# Patient Record
Sex: Male | Born: 1950
Health system: Southern US, Community
[De-identification: ages and names within clinical notes are randomized; demographics above are authoritative.]

## PROBLEM LIST (undated history)

## (undated) DIAGNOSIS — E785 Hyperlipidemia, unspecified: Secondary | ICD-10-CM

## (undated) DIAGNOSIS — E119 Type 2 diabetes mellitus without complications: Secondary | ICD-10-CM

## (undated) DIAGNOSIS — C61 Malignant neoplasm of prostate: Secondary | ICD-10-CM

## (undated) DIAGNOSIS — I10 Essential (primary) hypertension: Secondary | ICD-10-CM

## (undated) HISTORY — DX: Type 2 diabetes mellitus without complications: E11.9

## (undated) HISTORY — DX: Malignant neoplasm of prostate: C61

## (undated) HISTORY — PX: OTHER SURGICAL HISTORY: SHX169

## (undated) HISTORY — DX: Hyperlipidemia, unspecified: E78.5

## (undated) HISTORY — DX: Essential (primary) hypertension: I10

## (undated) HISTORY — PX: COLONOSCOPY: SHX174

---
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
## 1997-06-30 ENCOUNTER — Other Ambulatory Visit: Admission: RE | Admit: 1997-06-30 | Discharge: 1997-06-30 | Payer: Self-pay | Admitting: Family Medicine

## 2011-01-07 ENCOUNTER — Telehealth: Payer: Self-pay | Admitting: Internal Medicine

## 2011-01-07 NOTE — Telephone Encounter (Signed)
Received 12 pages from [facility]; forwarded to Dr. Leone Payor for review. 01/07/11-ar

## 2011-01-24 ENCOUNTER — Ambulatory Visit (AMBULATORY_SURGERY_CENTER): Payer: BC Managed Care – PPO

## 2011-01-24 ENCOUNTER — Encounter: Payer: Self-pay | Admitting: Internal Medicine

## 2011-01-24 VITALS — Ht 68.0 in | Wt 216.0 lb

## 2011-01-24 DIAGNOSIS — Z860100 Personal history of colon polyps, unspecified: Secondary | ICD-10-CM

## 2011-01-24 DIAGNOSIS — Z1211 Encounter for screening for malignant neoplasm of colon: Secondary | ICD-10-CM

## 2011-01-24 MED ORDER — PEG-KCL-NACL-NASULF-NA ASC-C 100 G PO SOLR: 1.0000 | Freq: Once | ORAL | Status: DC

## 2011-02-07 ENCOUNTER — Ambulatory Visit (AMBULATORY_SURGERY_CENTER): Payer: BC Managed Care – PPO | Admitting: Internal Medicine

## 2011-02-07 ENCOUNTER — Encounter: Payer: Self-pay | Admitting: Internal Medicine

## 2011-02-07 VITALS — BP 146/83 | HR 73 | Temp 97.8°F | Resp 17 | Ht 68.0 in | Wt 216.0 lb

## 2011-02-07 DIAGNOSIS — D126 Benign neoplasm of colon, unspecified: Secondary | ICD-10-CM

## 2011-02-07 DIAGNOSIS — Z860100 Personal history of colon polyps, unspecified: Secondary | ICD-10-CM | POA: Insufficient documentation

## 2011-02-07 DIAGNOSIS — D1330 Benign neoplasm of unspecified part of small intestine: Secondary | ICD-10-CM

## 2011-02-07 DIAGNOSIS — Z1211 Encounter for screening for malignant neoplasm of colon: Secondary | ICD-10-CM

## 2011-02-07 MED ORDER — SODIUM CHLORIDE 0.9 % IV SOLN: 500.0000 mL | INTRAVENOUS | Status: DC

## 2011-02-07 NOTE — Patient Instructions (Addendum)
Multiple (about 10) tiny polyps were removed today. I will send you a letter about the biopsy results and timing of next colonoscopy. I am going to ask for your prior colonoscopy records also, as that may help to determine when your next colonoscopy should be. Iva Boop, MD, [facility]   Discharge instructions per blue and green sheets  Handout on polyps  We will mail you a letter in 1-2 weeks with the  Pathology results and Dr Marvell Fuller recommendations

## 2011-02-07 NOTE — Progress Notes (Signed)
Patient did not experience any of the following events: a burn prior to discharge; a fall within the facility; wrong site/side/patient/procedure/implant event; or a hospital transfer or hospital admission upon discharge from the facility. (G8907) Patient did not have preoperative order for IV antibiotic SSI prophylaxis. (G8918)  

## 2011-02-07 NOTE — Op Note (Signed)
[facility] [address] [place], [place]  [address]  COLONOSCOPY PROCEDURE REPORT  PATIENT:  Bobby Boyle, Bobby Boyle  MR#:  [id number] BIRTHDATE:  1950-09-22, 60 yrs. old  GENDER:  male ENDOSCOPIST:  Iva Boop, MD, [facility] REF. BY:  Kellie Shropshire, M.D. PROCEDURE DATE:  02/07/2011 PROCEDURE:  Colonoscopy with biopsy ASA CLASS:  Class II INDICATIONS:  surveillance and high-risk screening, history of polyps prior colonoscopy with polyps - do not have records MEDICATIONS:   These medications were titrated to patient response per physician's verbal order, Fentanyl 100 mcg IV, Versed 10 mg IV  DESCRIPTION OF PROCEDURE:   After the risks benefits and alternatives of the procedure were thoroughly explained, informed consent was obtained.  Digital rectal exam was performed and revealed no rectal masses.   The LB CF-H180AL [id number] endoscope was introduced through the anus and advanced to the cecum, which was identified by both the appendix and ileocecal valve, without limitations.  The quality of the prep was adequate, using MoviPrep.  The instrument was then slowly withdrawn as the colon was fully examined. <<PROCEDUREIMAGES>>  FINDINGS:  There were multiple polyps identified and removed. Approximately 10 polyps removed. All diminutive polyps. 1 transverse polyp about 4 mm removed with cold snare. The remainder (1 hepatic flexure and the rest in cecum were 1-3 mm and removed with forceps).  This was otherwise a normal examination of the colon. Prep adequate after extensive irrigation only. Retroflexed views in the rectum revealed no abnormalities.    The time to cecum = 4:49 minutes. The scope was then withdrawn in 19:25 minutes from the cecum and the procedure completed. COMPLICATIONS:  None ENDOSCOPIC IMPRESSION: 1) Polyps, multiple removed (approximately 10 diminutive polyps)  2) Otherwise normal examination RECOMMENDATIONS: 1) Await biopsy results I need to see old  colonoscopy records also and we will request. (Prior procedure by Dr. Elnoria Howard). I think he may need closer follow-up and perhaps a different prep as only with irrigation did this achieve adequate level. REPEAT EXAM:  In for Colonoscopy, pending biopsy results.  Iva Boop, MD, [facility]  CC:  The Patient Kellie Shropshire, MD  n. Rosalie Doctor:   Iva Boop at 02/07/2011 03:52 PM  [place], Gerlene Burdock, [phone]

## 2011-02-08 ENCOUNTER — Telehealth: Payer: Self-pay | Admitting: *Deleted

## 2011-02-08 NOTE — Telephone Encounter (Signed)

## 2011-02-12 NOTE — Progress Notes (Signed)
Quick Note:  10 diminutive adenomas  Recommend repeat colonoscopy 1 year 01/2012 Will ask him to consider genetic testing ______

## 2011-04-24 DIAGNOSIS — C61 Malignant neoplasm of prostate: Secondary | ICD-10-CM

## 2011-04-24 HISTORY — DX: Malignant neoplasm of prostate: C61

## 2011-05-07 ENCOUNTER — Emergency Department (HOSPITAL_COMMUNITY)
Admission: EM | Admit: 2011-05-07 | Discharge: 2011-05-07 | Disposition: A | Discharge status: Home / Self Care | Payer: BC Managed Care – PPO | Attending: Emergency Medicine | Admitting: Emergency Medicine

## 2011-05-07 ENCOUNTER — Encounter (HOSPITAL_COMMUNITY): Payer: Self-pay | Admitting: Emergency Medicine

## 2011-05-07 ENCOUNTER — Other Ambulatory Visit: Payer: Self-pay

## 2011-05-07 ENCOUNTER — Emergency Department (HOSPITAL_COMMUNITY): Payer: BC Managed Care – PPO

## 2011-05-07 DIAGNOSIS — M25519 Pain in unspecified shoulder: Secondary | ICD-10-CM | POA: Insufficient documentation

## 2011-05-07 DIAGNOSIS — R509 Fever, unspecified: Secondary | ICD-10-CM | POA: Insufficient documentation

## 2011-05-07 DIAGNOSIS — I10 Essential (primary) hypertension: Secondary | ICD-10-CM | POA: Insufficient documentation

## 2011-05-07 DIAGNOSIS — E785 Hyperlipidemia, unspecified: Secondary | ICD-10-CM | POA: Insufficient documentation

## 2011-05-07 DIAGNOSIS — R059 Cough, unspecified: Secondary | ICD-10-CM | POA: Insufficient documentation

## 2011-05-07 DIAGNOSIS — R0602 Shortness of breath: Secondary | ICD-10-CM | POA: Insufficient documentation

## 2011-05-07 DIAGNOSIS — R6889 Other general symptoms and signs: Secondary | ICD-10-CM | POA: Insufficient documentation

## 2011-05-07 DIAGNOSIS — R109 Unspecified abdominal pain: Secondary | ICD-10-CM | POA: Insufficient documentation

## 2011-05-07 DIAGNOSIS — E119 Type 2 diabetes mellitus without complications: Secondary | ICD-10-CM | POA: Insufficient documentation

## 2011-05-07 DIAGNOSIS — J3489 Other specified disorders of nose and nasal sinuses: Secondary | ICD-10-CM | POA: Insufficient documentation

## 2011-05-07 DIAGNOSIS — J111 Influenza due to unidentified influenza virus with other respiratory manifestations: Secondary | ICD-10-CM | POA: Insufficient documentation

## 2011-05-07 DIAGNOSIS — R05 Cough: Secondary | ICD-10-CM | POA: Insufficient documentation

## 2011-05-07 LAB — CBC
HCT: 43.5 % (ref 39.0–52.0)
Hemoglobin: 14.3 g/dL (ref 13.0–17.0)
MCH: 26.7 pg (ref 26.0–34.0)
MCHC: 32.9 g/dL (ref 30.0–36.0)
MCV: 81.3 fL (ref 78.0–100.0)
Platelets: 164 10*3/uL (ref 150–400)
RBC: 5.35 MIL/uL (ref 4.22–5.81)
RDW: 14.6 % (ref 11.5–15.5)
WBC: 8.5 10*3/uL (ref 4.0–10.5)

## 2011-05-07 LAB — URINALYSIS, ROUTINE W REFLEX MICROSCOPIC
Bilirubin Urine: NEGATIVE
Glucose, UA: NEGATIVE mg/dL
Ketones, ur: NEGATIVE mg/dL
Leukocytes, UA: NEGATIVE
Nitrite: NEGATIVE
Protein, ur: NEGATIVE mg/dL
Specific Gravity, Urine: 1.013 (ref 1.005–1.030)
Urobilinogen, UA: 0.2 mg/dL (ref 0.0–1.0)
pH: 7.5 (ref 5.0–8.0)

## 2011-05-07 LAB — COMPREHENSIVE METABOLIC PANEL
ALT: 37 U/L (ref 0–53)
AST: 25 U/L (ref 0–37)
Albumin: 3.8 g/dL (ref 3.5–5.2)
Alkaline Phosphatase: 125 U/L — ABNORMAL HIGH (ref 39–117)
BUN: 14 mg/dL (ref 6–23)
CO2: 29 mEq/L (ref 19–32)
Calcium: 9.4 mg/dL (ref 8.4–10.5)
Chloride: 103 mEq/L (ref 96–112)
Creatinine, Ser: 0.86 mg/dL (ref 0.50–1.35)
GFR calc Af Amer: >90 mL/min (ref >90)
GFR calc non Af Amer: >90 mL/min (ref >90)
Glucose, Bld: 148 mg/dL — ABNORMAL HIGH (ref 70–99)
Potassium: 3.8 mEq/L (ref 3.5–5.1)
Sodium: 140 mEq/L (ref 135–145)
Total Bilirubin: 0.3 mg/dL (ref 0.3–1.2)
Total Protein: 7.4 g/dL (ref 6.0–8.3)

## 2011-05-07 LAB — URINE MICROSCOPIC-ADD ON

## 2011-05-07 LAB — DIFFERENTIAL
Basophils Absolute: 0 10*3/uL (ref 0.0–0.1)
Lymphocytes Relative: 13 % (ref 12–46)
Monocytes Absolute: 0.2 10*3/uL (ref 0.1–1.0)
Neutro Abs: 7.1 10*3/uL (ref 1.7–7.7)
Neutrophils Relative %: 83 % — ABNORMAL HIGH (ref 43–77)

## 2011-05-07 MED ORDER — ONDANSETRON HCL 4 MG/2ML IJ SOLN
4.0000 mg | Freq: Once | INTRAMUSCULAR | Status: AC
  Administered 2011-05-07: 4 mg via INTRAVENOUS
  Filled 2011-05-07: qty 2

## 2011-05-07 MED ORDER — SODIUM CHLORIDE 0.9 % IV BOLUS (SEPSIS)
500.0000 mL | Freq: Once | INTRAVENOUS | Status: AC
  Administered 2011-05-07: 500 mL via INTRAVENOUS

## 2011-05-07 MED ORDER — KETOROLAC TROMETHAMINE 30 MG/ML IJ SOLN
15.0000 mg | Freq: Once | INTRAMUSCULAR | Status: AC
  Administered 2011-05-07: 15 mg via INTRAVENOUS
  Filled 2011-05-07: qty 1

## 2011-05-07 MED ORDER — SODIUM CHLORIDE 0.9 % IV BOLUS (SEPSIS): 500.0000 mL | Freq: Once | INTRAVENOUS | Status: DC

## 2011-05-07 MED ORDER — ACETAMINOPHEN 325 MG PO TABS
650.0000 mg | ORAL_TABLET | Freq: Once | ORAL | Status: AC
  Administered 2011-05-07: 650 mg via ORAL
  Filled 2011-05-07: qty 2

## 2011-05-07 MED ORDER — MORPHINE SULFATE 4 MG/ML IJ SOLN
4.0000 mg | Freq: Once | INTRAMUSCULAR | Status: AC
  Administered 2011-05-07: 4 mg via INTRAVENOUS
  Filled 2011-05-07: qty 1

## 2011-05-07 MED ORDER — SODIUM CHLORIDE 0.9 % IV SOLN
Freq: Once | INTRAVENOUS | Status: AC
  Administered 2011-05-07: 08:00:00 via INTRAVENOUS

## 2011-05-07 NOTE — Discharge Instructions (Signed)
Please read and follow instructions below.    Please continue drinking plenty of fluids.  Use over-the-counter medicines as needed as directed on packaging for symptom relief.  You may also use ibuprofen or tylenol as directed on packaging for pain or fever.  Do not take multiple medicines containing Tylenol or acetaminophen to avoid taking too much of this medication.   Please return to the Emergency Department if you have a high fever not controlled with over-the-counter medications, persistent vomiting and cannot keep down fluids, trouble breathing, or any other concerns.  Please follow-up with your family doctor in the next week.  Your vital signs today:  BP 127/62  Pulse 89  Temp(Src) 99.7 F (37.6 C) (Oral)  Resp 21  SpO2 98%

## 2011-05-07 NOTE — ED Provider Notes (Signed)
Medical screening examination/treatment/procedure(s) were conducted as a shared visit with non-physician practitioner(s) and myself.  I personally evaluated the patient during the encounter.  Pt with mild tachcyardia, no hypotension, fevers, chills, aches, mild dry cough and rhinorrhea for 3 days, with this AM having sudden onset of rigors, chills, and myalgias.  Clinically, sounds like influenza.  abd is soft, no guard or rebound.  RA sats are now 98%, lungs clear, CXR is ok on my view.  Will treat with IVF's, toradol and reassess.  Pt did not have influenza vaccine this season.    Reis Pienta Y.    Gavin Pound. Oletta Lamas, MD 05/07/11 [phone]

## 2011-05-07 NOTE — ED Provider Notes (Signed)
History     CSN: [id number]  Arrival date & time 05/07/11  4098   First MD Initiated Contact with Patient 05/07/11 [phone]      Chief Complaint  Patient presents with  . Chills    (Consider location/radiation/quality/duration/timing/severity/associated sxs/prior treatment) HPI Comments: Patient with history of diabetes, hyperlipidemia, hypertension -- presents with onset of fever, cramping upper abdominal pain, shortness of breath, cough, shaking chills this morning. Patient began have some sneezing yesterday but otherwise felt okay. History of appendectomy. He is having normal bowel movements and is passing gas. Denies diarrhea. Denies urinary symptoms. Patient denies chest pain but states he did have some shoulder pain with the shaking chills. No treatments prior to arrival.  Patient is a 61 y.o. male presenting with shortness of breath. The history is provided by the patient.  Shortness of Breath  The current episode started yesterday. The onset was gradual. The problem has been gradually worsening. The symptoms are relieved by nothing. Associated symptoms include a fever, rhinorrhea, cough and shortness of breath. Pertinent negatives include no chest pain, no chest pressure, no sore throat and no wheezing.    Past Medical History  Diagnosis Date  . Diabetes mellitus     recent  . Hyperlipidemia   . Hypertension     Past Surgical History  Procedure Date  . Knee arthroscopy 1972    left  . Appendectomy 1971  . Colonoscopy     Family History  Problem Relation Age of Onset  . Hypertension    . Diabetes Mother   . Diabetes Brother   . Colon cancer Neg Hx     History  Substance Use Topics  . Smoking status: Never Smoker   . Smokeless tobacco: Never Used  . Alcohol Use: No      Review of Systems  Constitutional: Positive for fever.  HENT: Positive for rhinorrhea and sneezing. Negative for ear pain and sore throat.   Eyes: Negative for redness.  Respiratory:  Positive for cough and shortness of breath. Negative for wheezing.   Cardiovascular: Negative for chest pain.  Gastrointestinal: Positive for abdominal pain and abdominal distention. Negative for nausea, vomiting and diarrhea.  Genitourinary: Negative for dysuria.  Musculoskeletal: Negative for myalgias.  Skin: Negative for rash.  Neurological: Negative for headaches.    Allergies  Review of patient's allergies indicates no known allergies.  Home Medications   Current Outpatient Rx  Name Route Sig Dispense Refill  . ATORVASTATIN CALCIUM 20 MG PO TABS Oral Take 20 mg by mouth daily.     . AZOR 10-40 MG PO TABS Oral Take 1 tablet by mouth daily.     Marland Kitchen DM-GUAIFENESIN ER 30-600 MG PO TB12 Oral Take 1 tablet by mouth every 12 (twelve) hours.    Marland Kitchen METFORMIN HCL 500 MG PO TABS Oral Take 500 mg by mouth daily with breakfast.       BP 183/88  Pulse 103  Temp(Src) 100.5 F (38.1 C) (Oral)  Resp 20  SpO2 95%  Physical Exam  Nursing note and vitals reviewed. Constitutional: He is oriented to person, place, and time. He appears well-developed and well-nourished.       Fever noted  HENT:  Head: Normocephalic and atraumatic.  Nose: Rhinorrhea present.  Eyes: Conjunctivae are normal. Right eye exhibits no discharge. Left eye exhibits no discharge.  Neck: Normal range of motion. Neck supple.  Cardiovascular: Normal rate, regular rhythm, normal heart sounds and intact distal pulses.   No murmur heard. Pulmonary/Chest: Effort  normal and breath sounds normal. He has no wheezes. He has no rales.       O2 sat 92% on room air.  Abdominal: Soft. Bowel sounds are normal. He exhibits no distension. There is no tenderness. There is no rebound and no guarding.  Musculoskeletal: He exhibits no edema and no tenderness.  Lymphadenopathy:    He has no cervical adenopathy.  Neurological: He is alert and oriented to person, place, and time.  Skin: Skin is warm and dry.  Psychiatric: He has a normal  mood and affect.    ED Course  Procedures (including critical care time)  Labs Reviewed  DIFFERENTIAL - Abnormal; Notable for the following:    Neutrophils Relative 83 (*)    All other components within normal limits  COMPREHENSIVE METABOLIC PANEL - Abnormal; Notable for the following:    Glucose, Bld 148 (*)    Alkaline Phosphatase 125 (*)    All other components within normal limits  CBC  LIPASE, BLOOD  URINALYSIS, ROUTINE W REFLEX MICROSCOPIC   No results found.   No diagnosis found.  6:59 AM Patient seen and examined. Work-up initiated. Medications ordered.   Vital signs reviewed and are as follows: Filed Vitals:   05/07/11 0650  BP: 183/88  Pulse: 103  Temp:   Resp: 20   O2 sat was 92-93% on RA. O2 started, 2L [place].   7:51 AM EKG was not completed. I asked nurse to ensure this was done. Patient is resting comfortably.   8:11 AM  Date: 05/07/2011  Rate: 98  Rhythm: normal sinus rhythm  QRS Axis: normal  Intervals: normal  ST/T Wave abnormalities: normal  Conduction Disutrbances:none  Narrative Interpretation:   Old EKG Reviewed: none available  Dr. Oletta Lamas has seen patient.   8:58 AM Patient states he is feeling somewhat better. Continues to have stomach 'cramp'. Tachy at 106-108. Additional bolus ordered. Will continue to monitor temp. O2 sat normal on room air.   10:44 AM patient clinically improved with fluids and medication. He states that he is feeling better and that his abdominal pain is improved. Heart rate has decreased into the 90s and temperature is 99 degrees Fahrenheit. Encouraged to rest and drink plenty of fluids.  Patient told to return to ED or see their primary doctor if their symptoms worsen, high fever not controlled with tylenol, persistent vomiting, they feel they are dehydrated, or if they have any other concerns.  Patient and wife verbalized understanding and agreed with plan.     MDM  Patient with symptoms consistent with  influenza.  Vitals are stable, low-grade fever, improved.  No signs of dehydration, tolerating PO's.  Lungs are clear.  Supportive therapy indicated with return if symptoms worsen.  Patient counseled. Patient is stable and appears well at time of discharge.          Renne Crigler, [place] 05/07/11 1048

## 2011-05-07 NOTE — ED Notes (Signed)
Patient transported to X-ray 

## 2011-05-07 NOTE — ED Notes (Signed)
ECG given to Dr. Oletta Lamas no older copy in MUSE

## 2011-05-07 NOTE — ED Provider Notes (Signed)
Medical screening examination/treatment/procedure(s) were conducted as a shared visit with non-physician practitioner(s) and myself.  I personally evaluated the patient during the encounter.  Pt with mild tachcyardia, no hypotension, fevers, chills, aches, mild dry cough and rhinorrhea for 3 days, with this AM having sudden onset of rigors, chills, and myalgias.  Clinically, sounds like influenza.  abd is soft, no guard or rebound.  RA sats are now 98%, lungs clear, CXR is ok on my view.  Will treat with IVF's, toradol and reassess.  Pt did not have influenza vaccine this season.    Gavin Pound. Darrian Goodwill, MD 05/07/11 1052

## 2011-05-07 NOTE — ED Notes (Signed)
PT. REPORTS CHILLS ,LOW GRADE FEVER ,PRODUCTIVE COUGH AND MID ABDOMINAL CRAMPING ONSET THIS MORNING.

## 2011-05-22 ENCOUNTER — Encounter: Payer: Self-pay | Admitting: Radiation Oncology

## 2011-05-22 ENCOUNTER — Ambulatory Visit
Admission: RE | Admit: 2011-05-22 | Discharge: 2011-05-22 | Disposition: A | Discharge status: Home / Self Care | Payer: BC Managed Care – PPO | Source: Ambulatory Visit | Attending: Radiation Oncology | Admitting: Radiation Oncology

## 2011-05-22 ENCOUNTER — Ambulatory Visit: Payer: BC Managed Care – PPO | Admitting: Radiation Oncology

## 2011-05-22 ENCOUNTER — Ambulatory Visit: Payer: BC Managed Care – PPO

## 2011-05-22 VITALS — BP 176/104 | HR 84 | Temp 98.0°F | Resp 18 | Ht 68.0 in | Wt 214.2 lb

## 2011-05-22 DIAGNOSIS — C61 Malignant neoplasm of prostate: Secondary | ICD-10-CM | POA: Insufficient documentation

## 2011-05-22 DIAGNOSIS — Z79899 Other long term (current) drug therapy: Secondary | ICD-10-CM | POA: Insufficient documentation

## 2011-05-22 DIAGNOSIS — E785 Hyperlipidemia, unspecified: Secondary | ICD-10-CM | POA: Insufficient documentation

## 2011-05-22 DIAGNOSIS — I10 Essential (primary) hypertension: Secondary | ICD-10-CM | POA: Insufficient documentation

## 2011-05-22 DIAGNOSIS — E119 Type 2 diabetes mellitus without complications: Secondary | ICD-10-CM | POA: Insufficient documentation

## 2011-05-22 NOTE — Progress Notes (Signed)
Complete PATIENT MEASURE OF DISTRESS worksheet with a score of 0 submitted to social work. Also, complete NUTRITION RISK SCREEN without concerns submitted to Barbara Neff, RD. 

## 2011-05-22 NOTE — Progress Notes (Signed)
Patient presents to the clinic today accompanied by his wife for an consultation with Dr. Dayton Scrape. Patient is alert and oriented to person, place, and time. No distress noted. Steady gait noted. Pleasant affect noted. Patient denies pain at this time. Patient reports intermittent burning with urination. Patient reports seeing scant amounts of blood in urine since biopsy. Patient denies difficulty emptying his bladder. Patient reports that on average he gets up three times per night to void. Patient reports a strong urine stream. Patient denies nausea, vomiting, headache, dizziness or diarrhea. Reported all findings to Dr. Dayton Scrape.

## 2011-05-22 NOTE — Progress Notes (Signed)
Please see progress note under physician encounter 

## 2011-05-22 NOTE — Progress Notes (Signed)
[facility] Radiation Oncology NEW PATIENT EVALUATION  Name: Bobby Boyle MRN: [id number]  Date: 05/22/2011  DOB: 03/31/1950  Status: outpatient   CC: Alva Garnet., MD, MD  Lindaann Slough, MD    REFERRING PHYSICIAN: Lindaann Slough, MD   DIAGNOSIS: Stage TI C. intermediate risk adenocarcinoma prostate   HISTORY OF PRESENT ILLNESS:  Bobby Boyle is a 61 y.o. male who is seen today for the courtesy of Dr. Brunilda Payor for discussion of possible radiation therapy in the management of his stage TI C. intermediate risk adenocarcinoma prostate. His PSA rose from 3.3 in October 2011 to 4.5 by October 2012, then back down to  4.27 by 03/27/2011. He was seen by Dr. Brunilda Payor who performed ultrasound-guided biopsies on febrile 27 2013. His son had Gleason 6 (3+3) involving 5% of one core from the left lateral base and also Gleason 7 (3+4) involving 50% of one core from the left mid gland. His gland volume is 26.3 cc. He is doing well from a GU and GI standpoint. His I PSS score is 9. He claims to be potent.  PREVIOUS RADIATION THERAPY: No   PAST MEDICAL HISTORY:  has a past medical history of Hyperlipidemia; Hypertension; Prostate cancer; Diabetes mellitus; and Type II or unspecified type diabetes mellitus without mention of complication, not stated as uncontrolled.     PAST SURGICAL HISTORY:  Past Surgical History  Procedure Date  . Knee arthroscopy 1972    left  . Appendectomy 1971  . Colonoscopy   . Bilateral foot surgery      FAMILY HISTORY: family history includes Diabetes in his brother and mother; Hypertension in an unspecified family member; and Liver disease in his mother.  There is no history of Colon cancer and Cancer.   SOCIAL HISTORY:  reports that he has never smoked. He has never used smokeless tobacco. He reports that he drinks alcohol. He reports that he does not use illicit drugs. Married with 2 children and 3 foster children. 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   ALLERGIES: Review of patient's allergies indicates no known allergies.   MEDICATIONS:  Current Outpatient Prescriptions  Medication Sig Dispense Refill  . atorvastatin (LIPITOR) 20 MG tablet Take 20 mg by mouth daily.       . AZOR 10-40 MG per tablet Take 1 tablet by mouth daily.       Marland Kitchen dextromethorphan-guaiFENesin (MUCINEX DM) 30-600 MG per 12 hr tablet Take 1 tablet by mouth every 12 (twelve) hours.      . metFORMIN (GLUCOPHAGE) 500 MG tablet Take 500 mg by mouth daily with breakfast.       . levofloxacin (LEVAQUIN) 500 MG tablet Take 500 mg by mouth daily.         REVIEW OF SYSTEMS:  Pertinent items are noted in HPI.    PHYSICAL EXAM:  height is 5\' 8"  (1.727 m) and weight is 214 lb 3.2 oz (97.16 kg). His oral temperature is 98 F (36.7 C). His blood pressure is 176/104 and his pulse is 84. His respiration is 18.   Head and neck examination: Grossly unremarkable. Nodes: Without palpable cervical or supraclavicular lymphadenopathy. Chest: Lungs clear. Back: Without spinal or CVA tenderness. Heart: Regular rhythm. Abdomen: Without masses or organomegaly. Genitalia: Grossly unremarkable to inspection. Rectal: The prostate gland is normal in size and is without focal induration or nodularity. Extremities: Without edema. Neurologic examination: Grossly nonfocal   LABORATORY DATA:  Lab Results  Component Value Date  WBC 8.5 05/07/2011   HGB 14.3 05/07/2011   HCT 43.5 05/07/2011   MCV 81.3 05/07/2011   PLT 164 05/07/2011   Lab Results  Component Value Date   NA 140 05/07/2011   K 3.8 05/07/2011   CL 103 05/07/2011   CO2 29 05/07/2011   Lab Results  Component Value Date   ALT 37 05/07/2011   AST 25 05/07/2011   ALKPHOS 125* 05/07/2011   BILITOT 0.3 05/07/2011   PSA 4.27 from 03/27/2011.   IMPRESSION: Stage TI C. intermediate risk adenocarcinoma prostate. I explained to the patient and his wife that his prognosis is related to his stage, Gleason score,  and PSA level. His stage and PSA level are favorable while his Gleason score of 7 is of intermediate favorability. His management options include surgery versus close surveillance versus radiation therapy. Radiation therapy options include seed implantation alone versus external beam/IMRT. We discussed the potential acute and late toxicities of radiation therapy. He does plan to meet with Dr. Isabel Caprice to discussed robotic surgery in detail. I gave him my voicemail if he would like to discuss radiation therapy in more detail after meeting with Dr. Isabel Caprice.  PLAN: As discussed above.   I spent 60 minutes minutes face to face with the patient and more than 50% of that time was spent in counseling and/or coordination of care.

## 2011-05-22 NOTE — Progress Notes (Signed)
61 year old male. Married. 2 sons, 3 daughters. Employed International aid/development worker.  Prostate volume 26.30 cc. PSA Oct 2012 4.5; Down to 4.27 with Cipro  Prostate biopsy done 04/24/2011 revealed Gleason 6 in 5% of one core at the left base and 15% of another core at the left mid gland. Also, he has atypia at the right apex , left apex and HPPIN at the left midgland, left apex , right mid gland. Patient is most interested in radical robotic prostatectomy   NKDA No indication of a pacemaker No hx of radiation

## 2011-05-24 NOTE — Progress Notes (Signed)
Encounter addended by: Agnes Lawrence, RN on: 05/24/2011 10:58 AM<BR>     Documentation filed: Charges VN, Inpatient Patient Education

## 2011-06-27 ENCOUNTER — Ambulatory Visit
Admission: RE | Admit: 2011-06-27 | Discharge: 2011-06-27 | Disposition: A | Discharge status: Home / Self Care | Payer: BC Managed Care – PPO | Source: Ambulatory Visit | Attending: Radiation Oncology | Admitting: Radiation Oncology

## 2011-06-27 ENCOUNTER — Encounter: Payer: Self-pay | Admitting: Radiation Oncology

## 2011-06-27 ENCOUNTER — Other Ambulatory Visit: Payer: Self-pay | Admitting: Urology

## 2011-06-27 VITALS — BP 156/85 | HR 74 | Temp 97.2°F | Resp 18 | Ht 68.0 in | Wt 214.3 lb

## 2011-06-27 DIAGNOSIS — C61 Malignant neoplasm of prostate: Secondary | ICD-10-CM

## 2011-06-27 NOTE — Progress Notes (Signed)
Followup note: The patient returns today for his CT arch study. His prostate volume correlates with Dr. Madilyn Hook measurements at the time of his biopsy. I again discussed the potential acute and late toxicities of radiation therapy and consent was signed today.

## 2011-06-27 NOTE — Progress Notes (Addendum)
CT arch/treatment planning note:  Bobby Boyle was taken to the CT simulator. His pelvis was scanned. I contoured his prostate and obtained a volume of 31.9 cc which did include the proximal seminal vesicles. Prosthetic length 3.3 cm. The arch is open and he is a candidate for seed implantation. I prescribing 14,500 cGy utilizing I-125 seeds. He'll be implanted with the Nucletron system and undergo a real-time a planning. Consent was signed today.

## 2011-06-27 NOTE — Progress Notes (Signed)
Met with patient to discuss RO billing.  Patient had no concerns today. 

## 2011-06-27 NOTE — Progress Notes (Signed)
Patient presents to the clinic today unaccompanied for under treat visit with Dr. Dayton Scrape. Patient is alert and oriented to person, place, and time. No distress noted. Steady gait noted. Pleasant affect noted. Patient denies pain at this time. Patient reports intermittent burning with urination. Patient denies hematuria. Patient describes a constant steady stream of urine but, that he has notice its become weaker as he has gotten old. Patient reports on average he gets up twice during the night to void. Patient denies incontinence. 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 Patient reports that he has not told his two sons yet he has prostate cancer. Reported all findings to Dr. Dayton Scrape.

## 2011-06-27 NOTE — Progress Notes (Signed)
Encounter addended by: Maryln Gottron, MD on: 06/27/2011 11:40 AM<BR>     Documentation filed: Notes Section

## 2011-08-23 ENCOUNTER — Encounter (HOSPITAL_BASED_OUTPATIENT_CLINIC_OR_DEPARTMENT_OTHER): Payer: Self-pay | Admitting: *Deleted

## 2011-08-23 NOTE — Progress Notes (Signed)
History obtained from wife. To [facility] on 08/28/11 for pre op lab work. To [facility] on 09/03/11 @ 0845 -Npo after mn-fleets enema bowel prep that morning at home Ekg,CXR in epic.

## 2011-08-27 ENCOUNTER — Telehealth: Payer: Self-pay | Admitting: *Deleted

## 2011-08-27 NOTE — Telephone Encounter (Signed)
Called patient to ask question, lvm for a return call 

## 2011-08-28 LAB — COMPREHENSIVE METABOLIC PANEL
ALT: 27 U/L (ref 0–53)
AST: 24 U/L (ref 0–37)
Albumin: 4 g/dL (ref 3.5–5.2)
CO2: 26 mEq/L (ref 19–32)
Calcium: 9.4 mg/dL (ref 8.4–10.5)
Chloride: 100 mEq/L (ref 96–112)
Creatinine, Ser: 0.89 mg/dL (ref 0.50–1.35)
GFR calc non Af Amer: >90 mL/min (ref >90)
Sodium: 134 mEq/L — ABNORMAL LOW (ref 135–145)
Total Bilirubin: 0.3 mg/dL (ref 0.3–1.2)

## 2011-08-28 LAB — CBC
HCT: 43 % (ref 39.0–52.0)
Hemoglobin: 14.1 g/dL (ref 13.0–17.0)
MCV: 79.5 fL (ref 78.0–100.0)
RBC: 5.41 MIL/uL (ref 4.22–5.81)
WBC: 6.3 10*3/uL (ref 4.0–10.5)

## 2011-08-28 LAB — APTT: aPTT: 36 seconds (ref 24–37)

## 2011-08-28 LAB — PROTIME-INR: INR: 0.95 (ref 0.00–1.49)

## 2011-09-02 ENCOUNTER — Telehealth: Payer: Self-pay | Admitting: *Deleted

## 2011-09-02 NOTE — Telephone Encounter (Signed)
CALLED PATIENT TO REMIND OF PROCEDURE FOR 09-03-11, LVM, FOR A RETURN CALL

## 2011-09-03 ENCOUNTER — Encounter (HOSPITAL_BASED_OUTPATIENT_CLINIC_OR_DEPARTMENT_OTHER)
Admission: RE | Disposition: A | Discharge status: Home / Self Care | Payer: Self-pay | Source: Ambulatory Visit | Attending: Urology

## 2011-09-03 ENCOUNTER — Encounter (HOSPITAL_BASED_OUTPATIENT_CLINIC_OR_DEPARTMENT_OTHER): Payer: Self-pay | Admitting: Anesthesiology

## 2011-09-03 ENCOUNTER — Encounter: Payer: Self-pay | Admitting: Radiation Oncology

## 2011-09-03 ENCOUNTER — Ambulatory Visit (HOSPITAL_BASED_OUTPATIENT_CLINIC_OR_DEPARTMENT_OTHER)
Admission: RE | Admit: 2011-09-03 | Discharge: 2011-09-03 | Disposition: A | Discharge status: Home / Self Care | Payer: BC Managed Care – PPO | Source: Ambulatory Visit | Attending: Urology | Admitting: Urology

## 2011-09-03 ENCOUNTER — Ambulatory Visit (HOSPITAL_COMMUNITY): Payer: BC Managed Care – PPO

## 2011-09-03 ENCOUNTER — Encounter (HOSPITAL_BASED_OUTPATIENT_CLINIC_OR_DEPARTMENT_OTHER): Payer: Self-pay | Admitting: *Deleted

## 2011-09-03 ENCOUNTER — Ambulatory Visit (HOSPITAL_BASED_OUTPATIENT_CLINIC_OR_DEPARTMENT_OTHER): Payer: BC Managed Care – PPO | Admitting: Anesthesiology

## 2011-09-03 DIAGNOSIS — E119 Type 2 diabetes mellitus without complications: Secondary | ICD-10-CM | POA: Insufficient documentation

## 2011-09-03 DIAGNOSIS — E785 Hyperlipidemia, unspecified: Secondary | ICD-10-CM | POA: Insufficient documentation

## 2011-09-03 DIAGNOSIS — I10 Essential (primary) hypertension: Secondary | ICD-10-CM | POA: Insufficient documentation

## 2011-09-03 DIAGNOSIS — C61 Malignant neoplasm of prostate: Secondary | ICD-10-CM | POA: Insufficient documentation

## 2011-09-03 DIAGNOSIS — Z79899 Other long term (current) drug therapy: Secondary | ICD-10-CM | POA: Insufficient documentation

## 2011-09-03 HISTORY — PX: CYSTOSCOPY: SHX5120

## 2011-09-03 HISTORY — PX: RADIOACTIVE SEED IMPLANT: SHX5150

## 2011-09-03 LAB — GLUCOSE, CAPILLARY
Glucose-Capillary: 130 mg/dL — ABNORMAL HIGH (ref 70–99)
Glucose-Capillary: 123 mg/dL — ABNORMAL HIGH (ref 70–99)

## 2011-09-03 SURGERY — INSERTION, RADIATION SOURCE, PROSTATE
Anesthesia: General | Site: Prostate | Wound class: Clean Contaminated

## 2011-09-03 MED ORDER — HYDROCODONE-ACETAMINOPHEN 5-325 MG PO TABS
1.0000 | ORAL_TABLET | Freq: Four times a day (QID) | ORAL | Status: DC | PRN
  Administered 2011-09-03: 1 via ORAL

## 2011-09-03 MED ORDER — CIPROFLOXACIN IN D5W 400 MG/200ML IV SOLN
400.0000 mg | INTRAVENOUS | Status: AC
  Administered 2011-09-03: 400 mg via INTRAVENOUS

## 2011-09-03 MED ORDER — STERILE WATER FOR IRRIGATION IR SOLN
Status: DC | PRN
  Administered 2011-09-03: 3000 mL

## 2011-09-03 MED ORDER — PROPOFOL 10 MG/ML IV EMUL
INTRAVENOUS | Status: DC | PRN
  Administered 2011-09-03: 200 mg via INTRAVENOUS

## 2011-09-03 MED ORDER — IOHEXOL 350 MG/ML SOLN
INTRAVENOUS | Status: DC | PRN
  Administered 2011-09-03: 7 mL

## 2011-09-03 MED ORDER — ONDANSETRON HCL 4 MG/2ML IJ SOLN
INTRAMUSCULAR | Status: DC | PRN
  Administered 2011-09-03: 4 mg via INTRAVENOUS

## 2011-09-03 MED ORDER — FENTANYL CITRATE 0.05 MG/ML IJ SOLN: 25.0000 ug | INTRAMUSCULAR | Status: DC | PRN

## 2011-09-03 MED ORDER — PROMETHAZINE HCL 25 MG/ML IJ SOLN: 6.2500 mg | INTRAMUSCULAR | Status: DC | PRN

## 2011-09-03 MED ORDER — LACTATED RINGERS IV SOLN
INTRAVENOUS | Status: DC
  Administered 2011-09-03 (×3): via INTRAVENOUS

## 2011-09-03 MED ORDER — FLEET ENEMA 7-19 GM/118ML RE ENEM: 1.0000 | ENEMA | Freq: Once | RECTAL | Status: DC

## 2011-09-03 MED ORDER — HYDROCODONE-ACETAMINOPHEN 5-500 MG PO CAPS: 1.0000 | ORAL_CAPSULE | Freq: Four times a day (QID) | ORAL | Status: AC | PRN

## 2011-09-03 MED ORDER — STERILE WATER FOR IRRIGATION IR SOLN
Status: DC | PRN
  Administered 2011-09-03: 3 mL

## 2011-09-03 MED ORDER — FENTANYL CITRATE 0.05 MG/ML IJ SOLN
INTRAMUSCULAR | Status: DC | PRN
  Administered 2011-09-03 (×3): 12.5 ug via INTRAVENOUS
  Administered 2011-09-03 (×2): 25 ug via INTRAVENOUS
  Administered 2011-09-03: 50 ug via INTRAVENOUS
  Administered 2011-09-03: 25 ug via INTRAVENOUS
  Administered 2011-09-03 (×3): 12.5 ug via INTRAVENOUS
  Administered 2011-09-03: 25 ug via INTRAVENOUS
  Administered 2011-09-03: 50 ug via INTRAVENOUS

## 2011-09-03 MED ORDER — LIDOCAINE HCL (CARDIAC) 20 MG/ML IV SOLN
INTRAVENOUS | Status: DC | PRN
  Administered 2011-09-03: 80 mg via INTRAVENOUS

## 2011-09-03 MED ORDER — CIPROFLOXACIN HCL 500 MG PO TABS: 500.0000 mg | ORAL_TABLET | Freq: Two times a day (BID) | ORAL | Status: AC

## 2011-09-03 SURGICAL SUPPLY — 25 items
BAG URINE DRAINAGE (UROLOGICAL SUPPLIES) ×3 IMPLANT
BLADE SURG ROTATE 9660 (MISCELLANEOUS) ×3 IMPLANT
CATH FOLEY 2WAY SLVR  5CC 16FR (CATHETERS) ×2
CATH FOLEY 2WAY SLVR 5CC 16FR (CATHETERS) ×4 IMPLANT
CATH ROBINSON RED A/P 20FR (CATHETERS) ×3 IMPLANT
CLOTH BEACON ORANGE TIMEOUT ST (SAFETY) ×3 IMPLANT
COVER MAYO STAND STRL (DRAPES) ×3 IMPLANT
COVER TABLE BACK 60X90 (DRAPES) ×3 IMPLANT
DRSG TEGADERM 4X4.75 (GAUZE/BANDAGES/DRESSINGS) ×3 IMPLANT
DRSG TEGADERM 8X12 (GAUZE/BANDAGES/DRESSINGS) ×3 IMPLANT
GLOVE BIO SURGEON STRL SZ7 (GLOVE) ×9 IMPLANT
GLOVE BIO SURGEON STRL SZ7.5 (GLOVE) ×12 IMPLANT
GLOVE BIOGEL M 6.5 STRL (GLOVE) ×3 IMPLANT
GLOVE ECLIPSE 6.0 STRL STRAW (GLOVE) ×3 IMPLANT
GLOVE ECLIPSE 8.0 STRL XLNG CF (GLOVE) IMPLANT
GOWN SURGICAL LARGE (GOWNS) ×6 IMPLANT
HOLDER FOLEY CATH W/STRAP (MISCELLANEOUS) ×3 IMPLANT
IV WATER IRRIG STERILE 3000ML (IV SOLUTION) ×3 IMPLANT
PACK CYSTOSCOPY (CUSTOM PROCEDURE TRAY) ×3 IMPLANT
SELECTSEED ×3 IMPLANT
SPONGE GAUZE 4X4 12PLY (GAUZE/BANDAGES/DRESSINGS) ×3 IMPLANT
SYRINGE 10CC LL (SYRINGE) ×3 IMPLANT
TOWEL NATURAL 6PK STERILE (DISPOSABLE) ×6 IMPLANT
UNDERPAD 30X30 INCONTINENT (UNDERPADS AND DIAPERS) ×6 IMPLANT
WATER STERILE IRR 500ML POUR (IV SOLUTION) ×3 IMPLANT

## 2011-09-03 NOTE — Op Note (Signed)
Bobby Boyle is a 61 y.o.   09/03/2011  Preop diagnosis: Stage TI C. adenocarcinoma of prostate, Gleason 6 and 3+4.  Postop diagnosis: Same  Procedure done: I-125 seeds implantation, cystoscopy  Surgeon: Wendie Simmer. Bobby Boyle and Dr. Chipper Herb  Anesthesia: General  Indication: Patient is a 61 years old male who was diagnosed in February with prostate cancer Gleason 3+3 and 3.3+4. Treatment options were discussed with him and he elected to have seeds implantation. He is scheduled today for the procedure.  Procedure: Patient was identified by his wrist band and proper timeout was taken.  Under general anesthesia he was prepped and draped and placed in the dorsolithotomy position. A #16 French Foley catheter was inserted in the bladder. Ultrasound planning was done by Dr. Dayton Scrape. When planning was completed under fluoroscopy and with the Nucletron 58 seeds were implanted in the prostate through 28 needles. The total apparent activity is 23.3160 mCi. There appears to be good seeds distribution under fluoroscopy.  The Foley catheter was then removed. A flexible cystoscope was then inserted in the bladder. The anterior urethra is normal there is minimal prostatic hypertrophy. The bladder mucosa is normal. There is no stone, tumor or seed in the bladder. The ureteral orifices are in normal position and shape. The cystoscope was then removed. A #16 French Foley catheter was then inserted in the bladder.  Estimated blood loss: Minimal.  The patient tolerated the procedure well and left the OR in satisfactory condition to postanesthesia care unit.

## 2011-09-03 NOTE — Anesthesia Preprocedure Evaluation (Signed)
Anesthesia Evaluation  Patient identified by MRN, date of birth, ID band Patient awake    Reviewed: Allergy & Precautions, H&P , NPO status , Patient's Chart, lab work & pertinent test results, reviewed documented beta blocker date and time   Airway Mallampati: II TM Distance: >3 FB Neck ROM: full    Dental No notable dental hx.    Pulmonary neg pulmonary ROS,  breath sounds clear to auscultation  Pulmonary exam normal       Cardiovascular Exercise Tolerance: Good hypertension, On Medications Rhythm:regular Rate:Normal     Neuro/Psych negative neurological ROS  negative psych ROS   GI/Hepatic negative GI ROS, Neg liver ROS,   Endo/Other  Type 2, Oral Hypoglycemic Agents  Renal/GU negative Renal ROS  negative genitourinary   Musculoskeletal   Abdominal   Peds  Hematology negative hematology ROS (+)   Anesthesia Other Findings   Reproductive/Obstetrics negative OB ROS                           Anesthesia Physical Anesthesia Plan  ASA: II  Anesthesia Plan: General LMA   Post-op Pain Management:    Induction:   Airway Management Planned:   Additional Equipment:   Intra-op Plan:   Post-operative Plan:   Informed Consent: I have reviewed the patients History and Physical, chart, labs and discussed the procedure including the risks, benefits and alternatives for the proposed anesthesia with the patient or authorized representative who has indicated his/her understanding and acceptance.   Dental Advisory Given  Plan Discussed with: CRNA  Anesthesia Plan Comments:         Anesthesia Quick Evaluation

## 2011-09-03 NOTE — Transfer of Care (Signed)
Immediate Anesthesia Transfer of Care Note  Patient: Bobby Boyle  Procedure(s) Performed: Procedure(s) (LRB): RADIOACTIVE SEED IMPLANT (N/A) CYSTOSCOPY (N/A)  Patient Location: Patient transported to PACU with oxygen via face mask at 4 Liters / Min  Anesthesia Type: General  Level of Consciousness: awake and alert   Airway & Oxygen Therapy: Patient Spontanous Breathing and Patient connected to face mask oxygen  Post-op Assessment: Report given to PACU RN and Post -op Vital signs reviewed and stable  Post vital signs: Reviewed and stable  Dentition: Teeth and oropharynx remain in pre-op condition  Complications: No apparent anesthesia complications

## 2011-09-03 NOTE — Anesthesia Postprocedure Evaluation (Signed)
  Anesthesia Post-op Note  Patient: Bobby Boyle  Procedure(s) Performed: Procedure(s) (LRB): RADIOACTIVE SEED IMPLANT (N/A) CYSTOSCOPY (N/A)  Patient Location: PACU  Anesthesia Type: General  Level of Consciousness: awake and alert   Airway and Oxygen Therapy: Patient Spontanous Breathing  Post-op Pain: mild  Post-op Assessment: Post-op Vital signs reviewed, Patient's Cardiovascular Status Stable, Respiratory Function Stable, Patent Airway and No signs of Nausea or vomiting  Post-op Vital Signs: stable  Complications: No apparent anesthesia complications

## 2011-09-03 NOTE — Anesthesia Procedure Notes (Signed)
Procedure Name: LMA Insertion Date/Time: 09/03/2011 10:05 AM Performed by: Fran Lowes Pre-anesthesia Checklist: Patient identified, Emergency Drugs available, Suction available and Patient being monitored Patient Re-evaluated:Patient Re-evaluated prior to inductionOxygen Delivery Method: Circle System Utilized Preoxygenation: Pre-oxygenation with 100% oxygen Intubation Type: IV induction Ventilation: Mask ventilation without difficulty LMA: LMA inserted LMA Size: 5.0 Number of attempts: 1 Airway Equipment and Method: bite block Placement Confirmation: positive ETCO2 and breath sounds checked- equal and bilateral Tube secured with: Tape Dental Injury: Teeth and Oropharynx as per pre-operative assessment

## 2011-09-03 NOTE — Progress Notes (Signed)
[facility] Radiation Oncology Brachytherapy Operative Procedure Note  Name: Bobby Boyle MRN: [id number]  Date:   06/26/2011           DOB: [birth year]  Status:outpatient    NW:GNFAOZH,YQMVHQIO R., MD  Dr. Su Grand DIAGNOSIS: A 61 year old gentlemen with stage T1c adenocarcinoma of the prostate with a Gleason of 7 and a PSA of 4.3.  PROCEDURE: Insertion of radioactive I-125 seeds into the prostate gland.  RADIATION DOSE: 14500 Gy, definitive therapy.  TECHNIQUE: [name] was brought to the operating room with Dr. Brunilda Payor. He was placed in the dorsolithotomy position. He was catheterized and a rectal tube was inserted. The perineum was shaved, prepped and draped. The ultrasound probe was then introduced into the rectum to see the prostate gland.  TREATMENT DEVICE: A needle grid was attached to the ultrasound probe stand and anchor needles were placed.  COMPLEX ISODOSE CALCULATION: The prostate was imaged in 3D using a sagittal sweep of the prostate probe. These images were transferred to the planning computer. There, the prostate, urethra and rectum were defined on each axial reconstructed image. Then, the software created an optimized plan and a few seed positions were adjusted. Then the accepted plan was uploaded to the seed Selectron afterloading unit.  SPECIAL TREATMENT PROCEDURE/SUPERVISION AND HANDLING: The Nucletron FIRST system was used to place the needles under sagittal guidance. A total of 26 needles were used to deposit 58 seeds in the prostate gland. The individual seed activity was 0.402 mCi for a total implant activity of 23.3 mCi.  COMPLEX SIMULATION: At the end of the procedure, an anterior radiograph of the pelvis was obtained to document seed positioning and count. Cystoscopy was performed to check the urethra and bladder.  MICRODOSIMETRY: At the end of the procedure, the patient was emitting 0.2 mrem/hr at 1 meter. Accordingly, he was considered safe  for hospital discharge.  PLAN: The patient will return to the radiation oncology clinic for post implant CT dosimetry in three weeks.

## 2011-09-03 NOTE — Progress Notes (Signed)
End of treatment summary  Diagnosis: Stage TI C. intermediate risk adenocarcinoma prostate  Intent: Curative  Implant date: 09/03/2011  Urologist: Dr. Su Grand  Site/dose: Prostate, 14,500 cGy  Isotope: I-125 utilizing 58 seeds in 26 active needles  Narrative: The patient appears to have undergone a successful Nucletron seed Selectron implant with Dr. Brunilda Payor.  Plan: Followup visit to see Dr. Brunilda Payor and myself in approximately 3 weeks. We will obtain a CT scan at that time for his post implant dosimetry.

## 2011-09-03 NOTE — H&P (Signed)
History and Physical  Chief Complaint: -Adenocarcinoma of prostate  History of Present Illness: Bobby Boyle has prostate cancer diagnosed by biopsy on on 2/27.  He has Gleason 3+3 and 3+4 in 5-15% of 2 cores at the left base and left midgland.  He elected to have brachytherapy.  He is scheduled for the procedure.   Past Medical History  Diagnosis Date  . Hyperlipidemia   . Hypertension   . Prostate cancer   . Diabetes mellitus     recent  . Type II or unspecified type diabetes mellitus without mention of complication, not stated as uncontrolled    Past Surgical History  Procedure Date  . Knee arthroscopy 1972    left  . Appendectomy 1971  . Colonoscopy   . Bilateral foot surgery     Medications:Lipitor, Metgormin Allergies: No Known Allergies  Family History  Problem Relation Age of Onset  . Hypertension    . Diabetes Mother   . Liver disease Mother   . Diabetes Brother   . Colon cancer Neg Hx   . Cancer Neg Hx    Social History:  reports that he has never smoked. He has never used smokeless tobacco. He reports that he drinks alcohol. He reports that he does not use illicit drugs.  ROS: All systems are reviewed and negative except as noted.   Physical Exam:  Vital signs in last 24 hours:  ENT: WNL Neck: Supple, no cervical adenopathy Cardiovascular: Skin warm; not flushed Respiratory: Breaths quiet; no shortness of breath Abdomen: No masses Neurological: Normal sensation to touch Musculoskeletal: Normal motor function arms and legs Lymphatics: No inguinal adenopathy Skin: No rashes Genitourinary:Penis and scrotal contents are within normal limits. Rectal: Prostate is enlarged 30 gm, no nodules.  Seminal vesicles: not paslpable   Creatinine:  Basename 08/28/11 0829  CREATININE 0.89   Impression/Assessment:  Adenocarcinoma of prostate stage T1c  Plan:  I 125 seeds implantation  Bobby Boyle 09/03/2011, 1:12 AM

## 2011-09-04 ENCOUNTER — Encounter (HOSPITAL_BASED_OUTPATIENT_CLINIC_OR_DEPARTMENT_OTHER): Payer: Self-pay | Admitting: Urology

## 2011-09-24 ENCOUNTER — Ambulatory Visit: Payer: BC Managed Care – PPO | Admitting: Radiation Oncology

## 2011-10-09 ENCOUNTER — Encounter: Payer: Self-pay | Admitting: Radiation Oncology

## 2011-10-09 DIAGNOSIS — C61 Malignant neoplasm of prostate: Secondary | ICD-10-CM | POA: Insufficient documentation

## 2011-10-09 DIAGNOSIS — I10 Essential (primary) hypertension: Secondary | ICD-10-CM | POA: Insufficient documentation

## 2011-10-09 DIAGNOSIS — E785 Hyperlipidemia, unspecified: Secondary | ICD-10-CM | POA: Insufficient documentation

## 2011-10-15 ENCOUNTER — Telehealth: Payer: Self-pay | Admitting: *Deleted

## 2011-10-15 NOTE — Telephone Encounter (Signed)
CALLED PATIENT TO REMIND OF APPTS. FOR 10/16/11, LVM FOR A RETURN CALL

## 2011-10-16 ENCOUNTER — Ambulatory Visit
Admission: RE | Admit: 2011-10-16 | Discharge: 2011-10-16 | Disposition: A | Discharge status: Home / Self Care | Payer: BC Managed Care – PPO | Source: Ambulatory Visit | Attending: Radiation Oncology | Admitting: Radiation Oncology

## 2011-10-16 VITALS — BP 164/93 | HR 69 | Temp 97.7°F | Wt 218.7 lb

## 2011-10-16 DIAGNOSIS — C61 Malignant neoplasm of prostate: Secondary | ICD-10-CM

## 2011-10-16 DIAGNOSIS — Z79899 Other long term (current) drug therapy: Secondary | ICD-10-CM | POA: Insufficient documentation

## 2011-10-16 DIAGNOSIS — I10 Essential (primary) hypertension: Secondary | ICD-10-CM | POA: Insufficient documentation

## 2011-10-16 DIAGNOSIS — E119 Type 2 diabetes mellitus without complications: Secondary | ICD-10-CM | POA: Insufficient documentation

## 2011-10-16 DIAGNOSIS — E785 Hyperlipidemia, unspecified: Secondary | ICD-10-CM | POA: Insufficient documentation

## 2011-10-16 NOTE — Progress Notes (Signed)
Followup note:  Bobby Boyle visits today approximately 6 weeks following his prostate seed implant with Dr. Brunilda Payor in the management of his stage TI C. intermediate risk adenocarcinoma prostate. He is doing well from a GU and GI standpoint. He does report some urinary frequency with nocturia x3 which is his baseline. No GI difficulties. He saw Dr. Brunilda Payor to 3 weeks ago and he will see him back for a followup visit and PSA determination in October.  Physical examination not performed.  He underwent a CT scan today for his post implant dosimetry.  Impression: Satisfactory progress.  Plan: He'll maintain his followup with Dr. Brunilda Payor will see him back in October. We'll move ahead with his post implant dosimetry and for the results of Dr. Brunilda Payor.

## 2011-10-16 NOTE — Progress Notes (Signed)
Complex simulation note: The patient was taken to the CT simulator. His pelvis was scanned. The CT data set was sent to the [facility] system for contouring of his prostate and rectum. We'll then move ahead with his post implant dosimetry to assess the quality of his implant.

## 2011-10-16 NOTE — Progress Notes (Signed)
Patient here post completion of prostate implant.Has frequency and nocturia up to 3 times nightly.For most part stools are normal.Patient able to perform daily activities but has not resumed exercise.

## 2011-10-22 ENCOUNTER — Encounter: Payer: Self-pay | Admitting: Radiation Oncology

## 2011-10-22 NOTE — Progress Notes (Signed)
Post implant CT dosimetry/3-D simulation note:  The patient underwent post-implant CT dosimetry/3-D simulation today to assess the quality of his implant. Dose volume histograms were obtained for the prostate and rectum his intraoperative prostate volume by ultrasound was 27.7 cc and his postoperative prostate volume by CT was 25.2 cc. This is a close correlation. His prostate D 90 is 87.7% and V100 83.8% . 0.78 cc of rectum received the prescribed dose of 14,500 cGy. We did not need our target goal of 90% for his prostate D 90, but we did meet our V100 goal of 80%. After reviewing each isodose curve I feel it his implant is quite satisfactory with better than 75% of the prescribed dose, over 10,000 cGy, covering the base of the prostate. He is a low-risk for late rectal toxicity.

## 2011-11-05 ENCOUNTER — Telehealth: Payer: Self-pay | Admitting: Radiation Oncology

## 2011-11-05 NOTE — Telephone Encounter (Signed)
Indigent DENIED  Family Size: 5 HH INC: 67,054 MOD POV: 55,140 Valid Dates:11/05/2011 - 05/04/2012   **OVER QUALIFIED**

## 2011-11-18 ENCOUNTER — Encounter: Payer: Self-pay | Admitting: *Deleted

## 2012-01-03 ENCOUNTER — Encounter: Payer: Self-pay | Admitting: Internal Medicine

## 2012-10-21 ENCOUNTER — Encounter: Payer: Self-pay | Admitting: Internal Medicine

## 2013-08-10 ENCOUNTER — Other Ambulatory Visit: Payer: Self-pay | Admitting: Internal Medicine

## 2013-08-10 DIAGNOSIS — R209 Unspecified disturbances of skin sensation: Secondary | ICD-10-CM

## 2015-02-10 ENCOUNTER — Other Ambulatory Visit: Payer: Self-pay | Admitting: Family

## 2015-02-10 ENCOUNTER — Ambulatory Visit
Admission: RE | Admit: 2015-02-10 | Discharge: 2015-02-10 | Disposition: A | Discharge status: Home / Self Care | Payer: BLUE CROSS/BLUE SHIELD | Source: Ambulatory Visit | Attending: Family | Admitting: Family

## 2015-02-10 DIAGNOSIS — R52 Pain, unspecified: Secondary | ICD-10-CM

## 2015-03-27 DIAGNOSIS — I10 Essential (primary) hypertension: Secondary | ICD-10-CM | POA: Insufficient documentation

## 2016-04-09 DIAGNOSIS — J069 Acute upper respiratory infection, unspecified: Secondary | ICD-10-CM | POA: Diagnosis not present

## 2016-04-09 DIAGNOSIS — B349 Viral infection, unspecified: Secondary | ICD-10-CM | POA: Diagnosis not present

## 2016-04-09 DIAGNOSIS — I10 Essential (primary) hypertension: Secondary | ICD-10-CM | POA: Diagnosis not present

## 2016-06-21 DIAGNOSIS — N5201 Erectile dysfunction due to arterial insufficiency: Secondary | ICD-10-CM | POA: Diagnosis not present

## 2016-06-21 DIAGNOSIS — C61 Malignant neoplasm of prostate: Secondary | ICD-10-CM | POA: Diagnosis not present

## 2016-06-21 DIAGNOSIS — R351 Nocturia: Secondary | ICD-10-CM | POA: Diagnosis not present

## 2016-07-05 DIAGNOSIS — J302 Other seasonal allergic rhinitis: Secondary | ICD-10-CM | POA: Diagnosis not present

## 2016-07-05 DIAGNOSIS — J0140 Acute pansinusitis, unspecified: Secondary | ICD-10-CM | POA: Diagnosis not present

## 2016-11-15 DIAGNOSIS — J0110 Acute frontal sinusitis, unspecified: Secondary | ICD-10-CM | POA: Diagnosis not present

## 2016-11-15 DIAGNOSIS — J302 Other seasonal allergic rhinitis: Secondary | ICD-10-CM | POA: Diagnosis not present

## 2016-11-15 DIAGNOSIS — I10 Essential (primary) hypertension: Secondary | ICD-10-CM | POA: Diagnosis not present

## 2016-11-15 DIAGNOSIS — Z23 Encounter for immunization: Secondary | ICD-10-CM | POA: Diagnosis not present

## 2016-12-20 DIAGNOSIS — C61 Malignant neoplasm of prostate: Secondary | ICD-10-CM | POA: Diagnosis not present

## 2016-12-27 DIAGNOSIS — N5201 Erectile dysfunction due to arterial insufficiency: Secondary | ICD-10-CM | POA: Diagnosis not present

## 2016-12-27 DIAGNOSIS — R9721 Rising PSA following treatment for malignant neoplasm of prostate: Secondary | ICD-10-CM | POA: Diagnosis not present

## 2016-12-27 DIAGNOSIS — C61 Malignant neoplasm of prostate: Secondary | ICD-10-CM | POA: Diagnosis not present

## 2016-12-27 DIAGNOSIS — R351 Nocturia: Secondary | ICD-10-CM | POA: Diagnosis not present

## 2016-12-30 ENCOUNTER — Other Ambulatory Visit: Payer: Self-pay | Admitting: Urology

## 2016-12-30 DIAGNOSIS — R9721 Rising PSA following treatment for malignant neoplasm of prostate: Secondary | ICD-10-CM

## 2017-01-02 DIAGNOSIS — Z0000 Encounter for general adult medical examination without abnormal findings: Secondary | ICD-10-CM | POA: Diagnosis not present

## 2017-01-02 DIAGNOSIS — Z125 Encounter for screening for malignant neoplasm of prostate: Secondary | ICD-10-CM | POA: Diagnosis not present

## 2017-01-02 DIAGNOSIS — E559 Vitamin D deficiency, unspecified: Secondary | ICD-10-CM | POA: Diagnosis not present

## 2017-01-02 DIAGNOSIS — I10 Essential (primary) hypertension: Secondary | ICD-10-CM | POA: Diagnosis not present

## 2017-01-02 DIAGNOSIS — M19012 Primary osteoarthritis, left shoulder: Secondary | ICD-10-CM | POA: Diagnosis not present

## 2017-01-02 DIAGNOSIS — Z79899 Other long term (current) drug therapy: Secondary | ICD-10-CM | POA: Diagnosis not present

## 2017-01-31 ENCOUNTER — Encounter (HOSPITAL_COMMUNITY): Payer: BLUE CROSS/BLUE SHIELD

## 2017-01-31 ENCOUNTER — Encounter (HOSPITAL_COMMUNITY)
Admission: RE | Admit: 2017-01-31 | Discharge: 2017-01-31 | Disposition: A | Discharge status: Home / Self Care | Payer: Medicare Other | Source: Ambulatory Visit | Attending: Urology | Admitting: Urology

## 2017-01-31 ENCOUNTER — Encounter (HOSPITAL_COMMUNITY): Admission: RE | Admit: 2017-01-31 | Payer: Medicare Other | Source: Ambulatory Visit

## 2017-01-31 DIAGNOSIS — C61 Malignant neoplasm of prostate: Secondary | ICD-10-CM | POA: Diagnosis not present

## 2017-01-31 DIAGNOSIS — R972 Elevated prostate specific antigen [PSA]: Secondary | ICD-10-CM | POA: Diagnosis not present

## 2017-01-31 DIAGNOSIS — R9721 Rising PSA following treatment for malignant neoplasm of prostate: Secondary | ICD-10-CM | POA: Diagnosis not present

## 2017-01-31 MED ORDER — TECHNETIUM TC 99M MEDRONATE IV KIT
25.0000 | PACK | Freq: Once | INTRAVENOUS | Status: AC | PRN
  Administered 2017-01-31: 22 via INTRAVENOUS

## 2017-02-05 ENCOUNTER — Ambulatory Visit (INDEPENDENT_AMBULATORY_CARE_PROVIDER_SITE_OTHER): Payer: Medicare Other

## 2017-02-05 ENCOUNTER — Encounter (HOSPITAL_COMMUNITY): Payer: Self-pay | Admitting: Emergency Medicine

## 2017-02-05 ENCOUNTER — Other Ambulatory Visit: Payer: Self-pay

## 2017-02-05 ENCOUNTER — Ambulatory Visit (HOSPITAL_COMMUNITY)
Admission: EM | Admit: 2017-02-05 | Discharge: 2017-02-05 | Disposition: A | Discharge status: Home / Self Care | Payer: Medicare Other | Attending: Family Medicine | Admitting: Family Medicine

## 2017-02-05 DIAGNOSIS — M25511 Pain in right shoulder: Secondary | ICD-10-CM | POA: Diagnosis not present

## 2017-02-05 DIAGNOSIS — S40011A Contusion of right shoulder, initial encounter: Secondary | ICD-10-CM | POA: Diagnosis not present

## 2017-02-05 DIAGNOSIS — M79601 Pain in right arm: Secondary | ICD-10-CM | POA: Diagnosis not present

## 2017-02-05 DIAGNOSIS — S4991XA Unspecified injury of right shoulder and upper arm, initial encounter: Secondary | ICD-10-CM | POA: Diagnosis not present

## 2017-02-05 NOTE — Discharge Instructions (Signed)
You may use over the counter ibuprofen or acetaminophen as needed.  ° °

## 2017-02-05 NOTE — ED Provider Notes (Signed)
[place]   [id number] 02/05/17 Arrival Time: 9924  ASSESSMENT & PLAN:  1. Acute pain of right shoulder   2. Contusion of right shoulder, initial encounter    No fractures seen. Prefers OTC analgesics. Ensure ROM. Will f/u with PCP if not improving over the next several days. Reviewed expectations re: course of current medical issues. Questions answered. Outlined signs and symptoms indicating need for more acute intervention. Patient verbalized understanding. After Visit Summary given.   SUBJECTIVE:  Bobby Boyle is a 66 y.o. male who reports:  Shoulder Pain: Patient complaints of right shoulder pain. This is evaluated as a personal injury. The pain is described as aching. The onset of the pain was sudden, related to a fall from standing. Mechanism of injury: falling onto R shoulder. Pain is fairly persistent. Certain movements exacerbate. This does not limit him. Location: generalized R shoulder and R humerus. No history of dislocation. Symptoms are diminished by rest and Aleve. No extremity sensation changes or weakness. No PMH of shoulder injury.  ROS: As per HPI.   OBJECTIVE:  Vitals:   02/05/17 1112  BP: (!) 152/80  Pulse: 77  Resp: 16  Temp: 98.2 F (36.8 C)  SpO2: 100%    General appearance: alert; no distress Extremities: no cyanosis or edema; symmetrical with no gross deformities; tenderness over his R shoulder and R humerus with no swelling and no bruising; has FROM but with reported soreness CV: normal extremity capillary refill Skin: warm and dry Neurologic: normal gait; normal symmetric reflexes in all extremities; normal sensation Psychological: alert and cooperative; normal mood and affect  Imaging: Dg Shoulder Right  Result Date: 02/05/2017 CLINICAL DATA:  Right shoulder pain after fall yesterday. EXAM: RIGHT SHOULDER - 2+ VIEW COMPARISON:  None. FINDINGS: There is no evidence of fracture or dislocation. Mild degenerative changes seen  involving the right acromioclavicular joint. Soft tissues are unremarkable. IMPRESSION: Mild degenerative joint disease of the right acromioclavicular joint. No acute abnormality seen in the right shoulder. Electronically Signed   By: Marijo Conception, M.D.   On: 02/05/2017 12:02   Dg Humerus Right  Result Date: 02/05/2017 CLINICAL DATA:  The patient slipped yesterday at home and fell striking the right arm. Patient reports distal humeral pain radiating to the shoulder. Limited range of mention due to shoulder pain. EXAM: RIGHT HUMERUS - 2+ VIEW COMPARISON:  None in PACs FINDINGS: The humerus is subjectively adequately mineralized. No acute fracture or dislocation is observed. The observed portions of the shoulder and elbow exhibit no acute abnormality. There may be an accessory ossicle or old avulsion involving the medial epicondylar region. The overlying soft tissues are normal. IMPRESSION: No acute bony abnormality of the right humerus is observed. Electronically Signed   By: David  Martinique M.D.   On: 02/05/2017 11:53   No Known Allergies  Past Medical History:  Diagnosis Date  . Diabetes mellitus    recent  . Hyperlipidemia   . Hypertension   . Prostate cancer ([place]) 04/24/11   Gleason 7, vol 26.3 cc  . Type II or unspecified type diabetes mellitus without mention of complication, not stated as uncontrolled    Social History   Socioeconomic History  . Marital status: Married    Spouse name: Not on file  . Number of children: Not on file  . Years of education: Not on file  . Highest education level: Not on file  Social Needs  . Financial resource strain: Not on file  . Food insecurity -  worry: Not on file  . Food insecurity - inability: Not on file  . Transportation needs - medical: Not on file  . Transportation needs - non-medical: Not on file  Occupational History  . Not on file  Tobacco Use  . Smoking status: Never Smoker  . Smokeless tobacco: Never Used  Substance and Sexual  Activity  . Alcohol use: Yes    Comment: occasionally  . Drug use: No  . Sexual activity: Not on file  Other Topics Concern  . 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 Diabetes Mother   . Liver disease Mother   . Hypertension Unknown   . Diabetes Brother   . Colon cancer Neg Hx   . Cancer Neg Hx    Past Surgical History:  Procedure Laterality Date  . APPENDECTOMY  1971  . bilateral foot surgery    . COLONOSCOPY    . CYSTOSCOPY  09/03/2011   Procedure: CYSTOSCOPY;  Surgeon: Hanley Ben, MD;  Location: [facility];  Service: Urology;  Laterality: N/A;  no seeds noted in bladder  . KNEE ARTHROSCOPY  1972   left  . RADIOACTIVE SEED IMPLANT  09/03/2011   Procedure: RADIOACTIVE SEED IMPLANT;  Surgeon: Hanley Ben, MD;  Location: [facility];  Service: Urology;  Laterality: N/A;  58 seeds implanted     Vanessa Kick, MD 02/10/17 [phone]

## 2017-02-05 NOTE — ED Triage Notes (Signed)
Pt states he slipped on the ice yesterday and landed on his R shoulder, c/o R shoulder, R arm pain. No deformities, full ROM. C/o soreness. Denies hitting head, denies LOC.

## 2017-02-06 DIAGNOSIS — N5201 Erectile dysfunction due to arterial insufficiency: Secondary | ICD-10-CM | POA: Diagnosis not present

## 2017-02-06 DIAGNOSIS — C61 Malignant neoplasm of prostate: Secondary | ICD-10-CM | POA: Diagnosis not present

## 2017-03-14 DIAGNOSIS — Z8546 Personal history of malignant neoplasm of prostate: Secondary | ICD-10-CM | POA: Diagnosis not present

## 2017-03-14 DIAGNOSIS — C61 Malignant neoplasm of prostate: Secondary | ICD-10-CM | POA: Diagnosis not present

## 2017-03-21 DIAGNOSIS — R3916 Straining to void: Secondary | ICD-10-CM | POA: Diagnosis not present

## 2017-03-21 DIAGNOSIS — C61 Malignant neoplasm of prostate: Secondary | ICD-10-CM | POA: Diagnosis not present

## 2017-03-21 DIAGNOSIS — N401 Enlarged prostate with lower urinary tract symptoms: Secondary | ICD-10-CM | POA: Diagnosis not present

## 2017-03-21 DIAGNOSIS — N5201 Erectile dysfunction due to arterial insufficiency: Secondary | ICD-10-CM | POA: Diagnosis not present

## 2017-06-20 DIAGNOSIS — C61 Malignant neoplasm of prostate: Secondary | ICD-10-CM | POA: Diagnosis not present

## 2017-06-27 DIAGNOSIS — R351 Nocturia: Secondary | ICD-10-CM | POA: Diagnosis not present

## 2017-06-27 DIAGNOSIS — N5201 Erectile dysfunction due to arterial insufficiency: Secondary | ICD-10-CM | POA: Diagnosis not present

## 2017-06-27 DIAGNOSIS — N401 Enlarged prostate with lower urinary tract symptoms: Secondary | ICD-10-CM | POA: Diagnosis not present

## 2017-06-27 DIAGNOSIS — C61 Malignant neoplasm of prostate: Secondary | ICD-10-CM | POA: Diagnosis not present

## 2017-09-22 DIAGNOSIS — L910 Hypertrophic scar: Secondary | ICD-10-CM | POA: Diagnosis not present

## 2017-09-22 DIAGNOSIS — L089 Local infection of the skin and subcutaneous tissue, unspecified: Secondary | ICD-10-CM | POA: Diagnosis not present

## 2017-09-22 DIAGNOSIS — B360 Pityriasis versicolor: Secondary | ICD-10-CM | POA: Diagnosis not present

## 2017-12-26 DIAGNOSIS — C61 Malignant neoplasm of prostate: Secondary | ICD-10-CM | POA: Diagnosis not present

## 2018-01-02 DIAGNOSIS — R351 Nocturia: Secondary | ICD-10-CM | POA: Diagnosis not present

## 2018-01-02 DIAGNOSIS — N5201 Erectile dysfunction due to arterial insufficiency: Secondary | ICD-10-CM | POA: Diagnosis not present

## 2018-01-02 DIAGNOSIS — C61 Malignant neoplasm of prostate: Secondary | ICD-10-CM | POA: Diagnosis not present

## 2018-01-02 DIAGNOSIS — N401 Enlarged prostate with lower urinary tract symptoms: Secondary | ICD-10-CM | POA: Diagnosis not present

## 2018-01-02 DIAGNOSIS — Z23 Encounter for immunization: Secondary | ICD-10-CM | POA: Diagnosis not present

## 2018-01-08 DIAGNOSIS — R7309 Other abnormal glucose: Secondary | ICD-10-CM | POA: Diagnosis not present

## 2018-01-08 DIAGNOSIS — E559 Vitamin D deficiency, unspecified: Secondary | ICD-10-CM | POA: Diagnosis not present

## 2018-01-08 DIAGNOSIS — Z0000 Encounter for general adult medical examination without abnormal findings: Secondary | ICD-10-CM | POA: Diagnosis not present

## 2018-01-08 DIAGNOSIS — Z79899 Other long term (current) drug therapy: Secondary | ICD-10-CM | POA: Diagnosis not present

## 2018-01-08 DIAGNOSIS — E785 Hyperlipidemia, unspecified: Secondary | ICD-10-CM | POA: Diagnosis not present

## 2018-06-26 DIAGNOSIS — C61 Malignant neoplasm of prostate: Secondary | ICD-10-CM | POA: Diagnosis not present

## 2018-08-04 DIAGNOSIS — N5201 Erectile dysfunction due to arterial insufficiency: Secondary | ICD-10-CM | POA: Diagnosis not present

## 2018-08-04 DIAGNOSIS — R351 Nocturia: Secondary | ICD-10-CM | POA: Diagnosis not present

## 2018-08-04 DIAGNOSIS — N401 Enlarged prostate with lower urinary tract symptoms: Secondary | ICD-10-CM | POA: Diagnosis not present

## 2018-08-04 DIAGNOSIS — C61 Malignant neoplasm of prostate: Secondary | ICD-10-CM | POA: Diagnosis not present

## 2018-10-15 DIAGNOSIS — Z79899 Other long term (current) drug therapy: Secondary | ICD-10-CM | POA: Diagnosis not present

## 2018-10-15 DIAGNOSIS — I10 Essential (primary) hypertension: Secondary | ICD-10-CM | POA: Diagnosis not present

## 2019-01-01 DIAGNOSIS — Z23 Encounter for immunization: Secondary | ICD-10-CM | POA: Diagnosis not present

## 2019-01-26 DIAGNOSIS — C61 Malignant neoplasm of prostate: Secondary | ICD-10-CM | POA: Diagnosis not present

## 2019-02-02 DIAGNOSIS — R351 Nocturia: Secondary | ICD-10-CM | POA: Diagnosis not present

## 2019-02-02 DIAGNOSIS — N5201 Erectile dysfunction due to arterial insufficiency: Secondary | ICD-10-CM | POA: Diagnosis not present

## 2019-02-02 DIAGNOSIS — C61 Malignant neoplasm of prostate: Secondary | ICD-10-CM | POA: Diagnosis not present

## 2019-02-02 DIAGNOSIS — N401 Enlarged prostate with lower urinary tract symptoms: Secondary | ICD-10-CM | POA: Diagnosis not present

## 2019-04-22 DIAGNOSIS — Z79899 Other long term (current) drug therapy: Secondary | ICD-10-CM | POA: Diagnosis not present

## 2019-04-22 DIAGNOSIS — E559 Vitamin D deficiency, unspecified: Secondary | ICD-10-CM | POA: Diagnosis not present

## 2019-04-22 DIAGNOSIS — M1990 Unspecified osteoarthritis, unspecified site: Secondary | ICD-10-CM | POA: Insufficient documentation

## 2019-04-22 DIAGNOSIS — Z0000 Encounter for general adult medical examination without abnormal findings: Secondary | ICD-10-CM | POA: Diagnosis not present

## 2019-04-22 DIAGNOSIS — E119 Type 2 diabetes mellitus without complications: Secondary | ICD-10-CM | POA: Diagnosis not present

## 2019-04-22 DIAGNOSIS — M6281 Muscle weakness (generalized): Secondary | ICD-10-CM | POA: Diagnosis not present

## 2019-04-22 DIAGNOSIS — E789 Disorder of lipoprotein metabolism, unspecified: Secondary | ICD-10-CM | POA: Diagnosis not present

## 2019-04-22 DIAGNOSIS — I10 Essential (primary) hypertension: Secondary | ICD-10-CM | POA: Diagnosis not present

## 2019-04-22 DIAGNOSIS — N401 Enlarged prostate with lower urinary tract symptoms: Secondary | ICD-10-CM | POA: Diagnosis not present

## 2019-04-26 ENCOUNTER — Ambulatory Visit
Admission: RE | Admit: 2019-04-26 | Discharge: 2019-04-26 | Disposition: A | Discharge status: Home / Self Care | Payer: Medicare Other | Source: Ambulatory Visit | Attending: Family | Admitting: Family

## 2019-04-26 ENCOUNTER — Other Ambulatory Visit: Payer: Self-pay | Admitting: Family

## 2019-04-26 DIAGNOSIS — R52 Pain, unspecified: Secondary | ICD-10-CM

## 2019-04-26 DIAGNOSIS — R29898 Other symptoms and signs involving the musculoskeletal system: Secondary | ICD-10-CM

## 2019-04-26 DIAGNOSIS — M542 Cervicalgia: Secondary | ICD-10-CM | POA: Diagnosis not present

## 2019-04-29 DIAGNOSIS — E119 Type 2 diabetes mellitus without complications: Secondary | ICD-10-CM | POA: Insufficient documentation

## 2019-04-29 DIAGNOSIS — R972 Elevated prostate specific antigen [PSA]: Secondary | ICD-10-CM | POA: Insufficient documentation

## 2019-09-02 DIAGNOSIS — C61 Malignant neoplasm of prostate: Secondary | ICD-10-CM | POA: Diagnosis not present

## 2019-09-09 DIAGNOSIS — C61 Malignant neoplasm of prostate: Secondary | ICD-10-CM | POA: Diagnosis not present

## 2019-09-09 DIAGNOSIS — N5201 Erectile dysfunction due to arterial insufficiency: Secondary | ICD-10-CM | POA: Diagnosis not present

## 2019-09-09 DIAGNOSIS — N400 Enlarged prostate without lower urinary tract symptoms: Secondary | ICD-10-CM | POA: Diagnosis not present

## 2019-09-16 ENCOUNTER — Other Ambulatory Visit (HOSPITAL_COMMUNITY): Payer: Self-pay | Admitting: Urology

## 2019-09-16 DIAGNOSIS — R9721 Rising PSA following treatment for malignant neoplasm of prostate: Secondary | ICD-10-CM

## 2019-10-14 ENCOUNTER — Ambulatory Visit (HOSPITAL_COMMUNITY)
Admission: RE | Admit: 2019-10-14 | Discharge: 2019-10-14 | Disposition: A | Discharge status: Home / Self Care | Payer: Medicare Other | Source: Ambulatory Visit | Attending: Urology | Admitting: Urology

## 2019-10-14 ENCOUNTER — Other Ambulatory Visit: Payer: Self-pay

## 2019-10-14 DIAGNOSIS — R9721 Rising PSA following treatment for malignant neoplasm of prostate: Secondary | ICD-10-CM | POA: Diagnosis not present

## 2019-10-14 DIAGNOSIS — C61 Malignant neoplasm of prostate: Secondary | ICD-10-CM | POA: Diagnosis not present

## 2019-10-14 MED ORDER — AXUMIN (FLUCICLOVINE F 18) INJECTION
9.6000 | Freq: Once | INTRAVENOUS | Status: AC | PRN
  Administered 2019-10-14: 9.6 via INTRAVENOUS

## 2019-10-15 DIAGNOSIS — C61 Malignant neoplasm of prostate: Secondary | ICD-10-CM | POA: Diagnosis not present

## 2019-10-17 ENCOUNTER — Ambulatory Visit
Admission: RE | Admit: 2019-10-17 | Discharge: 2019-10-17 | Disposition: A | Discharge status: Home / Self Care | Payer: Medicare Other | Source: Ambulatory Visit | Attending: Physician Assistant | Admitting: Physician Assistant

## 2019-10-17 ENCOUNTER — Other Ambulatory Visit: Payer: Self-pay

## 2019-10-17 DIAGNOSIS — Z1152 Encounter for screening for COVID-19: Secondary | ICD-10-CM | POA: Diagnosis not present

## 2019-10-17 NOTE — Discharge Instructions (Signed)

## 2019-10-17 NOTE — ED Triage Notes (Signed)
Pt presents for COVID test after positive exposure at his job x 2 days ago.

## 2019-10-18 LAB — SARS-COV-2, NAA 2 DAY TAT

## 2019-10-18 LAB — NOVEL CORONAVIRUS, NAA: SARS-CoV-2, NAA: NOT DETECTED

## 2019-11-16 DIAGNOSIS — C61 Malignant neoplasm of prostate: Secondary | ICD-10-CM | POA: Diagnosis not present

## 2019-12-04 DIAGNOSIS — Z23 Encounter for immunization: Secondary | ICD-10-CM | POA: Diagnosis not present

## 2019-12-16 ENCOUNTER — Telehealth: Payer: Self-pay | Admitting: *Deleted

## 2019-12-16 NOTE — Telephone Encounter (Signed)
LVM for a call back to schedule appointment with Dr. Tammi Klippel

## 2020-01-10 ENCOUNTER — Encounter: Payer: Self-pay | Admitting: Radiation Oncology

## 2020-01-10 NOTE — Progress Notes (Signed)
GU Location of Tumor / Histology: Recurrent prostate cancers/p brachytherapy  If Prostate Cancer, Gleason Score is (3 + 4) and PSA is (4.27). Prostate volume: 22.54 grams  Bobby Boyle was diagnosed with prostate cancer in February 2013. Patient underwent brachytherapy in July 2013. PET done 10/15/2019 revealed uptake on left side of prostate. Repeat prostate biopsy done September 2021 reveal recurrence.   Biopsies of prostate (if applicable) revealed:    Past/Anticipated interventions by urology, if any: prostate biopsy, surveillance, prostate biopsy, referral for consideration of adjuvant radiation therapy  Past/Anticipated interventions by medical oncology, if any: no  Weight changes, if any: denies  Bowel/Bladder complaints, if any: IPSS 7. SHIM 15. Reports taking medication for ED but uncertain the name of the med. Denies dysuria or hematuria. Reports urinary leakage "only if I try to hold it for too long." Denies any bowel complaints.    Nausea/Vomiting, if any: denies  Pain issues, if any:  Reports new onset intermittent right side abdominal pain 5 on a scale of 0-10.  SAFETY ISSUES:  Prior radiation? yes, seed implant 2013  Pacemaker/ICD? denies  Possible current pregnancy? no, male patient  Is the patient on methotrexate? denies  Current Complaints / other details:  69 year old male. Married. 2 children and 3 foster children. Semi retired. Works at [employer].

## 2020-01-11 ENCOUNTER — Encounter: Payer: Self-pay | Admitting: Radiation Oncology

## 2020-01-11 ENCOUNTER — Encounter: Payer: Self-pay | Admitting: Medical Oncology

## 2020-01-11 ENCOUNTER — Other Ambulatory Visit: Payer: Self-pay

## 2020-01-11 ENCOUNTER — Ambulatory Visit
Admission: RE | Admit: 2020-01-11 | Discharge: 2020-01-11 | Disposition: A | Discharge status: Home / Self Care | Payer: Medicare Other | Source: Ambulatory Visit | Attending: Radiation Oncology | Admitting: Radiation Oncology

## 2020-01-11 VITALS — BP 129/73 | HR 85 | Temp 97.9°F | Resp 20 | Ht 67.5 in | Wt 215.4 lb

## 2020-01-11 DIAGNOSIS — E119 Type 2 diabetes mellitus without complications: Secondary | ICD-10-CM | POA: Insufficient documentation

## 2020-01-11 DIAGNOSIS — R9721 Rising PSA following treatment for malignant neoplasm of prostate: Secondary | ICD-10-CM | POA: Diagnosis not present

## 2020-01-11 DIAGNOSIS — Z8546 Personal history of malignant neoplasm of prostate: Secondary | ICD-10-CM | POA: Diagnosis not present

## 2020-01-11 DIAGNOSIS — I10 Essential (primary) hypertension: Secondary | ICD-10-CM | POA: Diagnosis not present

## 2020-01-11 DIAGNOSIS — C61 Malignant neoplasm of prostate: Secondary | ICD-10-CM

## 2020-01-11 DIAGNOSIS — Z79899 Other long term (current) drug therapy: Secondary | ICD-10-CM | POA: Insufficient documentation

## 2020-01-11 DIAGNOSIS — E785 Hyperlipidemia, unspecified: Secondary | ICD-10-CM | POA: Insufficient documentation

## 2020-01-11 DIAGNOSIS — Z923 Personal history of irradiation: Secondary | ICD-10-CM | POA: Insufficient documentation

## 2020-01-11 DIAGNOSIS — Z7984 Long term (current) use of oral hypoglycemic drugs: Secondary | ICD-10-CM | POA: Diagnosis not present

## 2020-01-11 NOTE — Progress Notes (Signed)
Radiation Oncology         [phone]) [phone] ________________________________  Initial outpatient Consultation  Name: Bobby Boyle MRN: [id number]  Date: 01/11/2020  DOB: 1950/08/22  FM:[id number], Gayland Curry, FNP  Robley Fries, MD   REFERRING PHYSICIAN: Robley Fries, MD  DIAGNOSIS: 69 y.o. gentleman with locally recurrent Gleason 3+4 prostate cancer s/p brachytherapy in 08/2011.    ICD-10-CM   1. Prostate cancer [facility])  C61     HISTORY OF PRESENT ILLNESS: Bobby Boyle is a 69 y.o. male with a diagnosis of prostate cancer. He has been followed for an elevated PSA since at least 2012. He was initially diagnosed with low volume Gleason 3+4 prostate cancer in 2 of 12 biopsy samples in 03/2011 with a PSA of 4.7 at the time of diagnosis, under the care of Dr. Lowella Bandy. He was referred to Dr. Valere Dross in 04/2011 and elected to proceed with brachytherapy as definitive treatment of his disease. This was performed on 09/03/2011 under the care of Dr. Valere Dross and Dr. Janice Norrie.  He had a good response to treatment with PSA nadir at 0.95 in 12/2012. However, the PSA began to rise thereafter and increased to 2.4 by 09/2014. He had a repeat biopsy later that month, which was entirely benign. His PSA continued to fluctuate and was further elevated at 2.92 in 11/2016 which prompted staging CT A/P and bone scans, performed in 01/2017, which were negative. A repeat surveillance biopsy was performed by Dr. Alyson Ingles in 02/2017 and was also negative.  His PSA remained stable, between 3-4 until 01/2019 when it reached 4.14 and further elevated to 4.27 on 09/03/19. He met with Dr. Gloriann Loan at that time and subsequently underwent Axumin PET scan on 10/14/2019 for further evaluation.  This scan showed focal activity in the left aspect of the prostate gland, concerning for residual carcinoma but was without evidence of metastatic lymphadenopathy, visceral metastasis, or skeletal metastasis. A repeat prostate biopsy was  performed on 11/16/19 and confirmed Gleason 3+4 in 1 of 18 core biopsy samples, seen in the left base. In Dr. Purvis Sheffield absence, his results were discussed with Dr. Jacalyn Lefevre who recommended possible salvage radiation.  The patient reviewed the biopsy results with his urologist and he has kindly been referred today for discussion of potential salvage radiation treatment options.   PREVIOUS RADIATION THERAPY: Yes  09/03/2011: Prostate Seed Implant, 145 Gy; definitive brachytherapy (Drs. Valere Dross and Nesi)  PAST MEDICAL HISTORY:  Past Medical History:  Diagnosis Date  . Diabetes mellitus    recent  . Hyperlipidemia   . Hypertension   . Prostate cancer ([place]) 04/24/11   Gleason 7, vol 26.3 cc  . Type II or unspecified type diabetes mellitus without mention of complication, not stated as uncontrolled       PAST SURGICAL HISTORY: Past Surgical History:  Procedure Laterality Date  . APPENDECTOMY  1971  . bilateral foot surgery    . COLONOSCOPY    . CYSTOSCOPY  09/03/2011   Procedure: CYSTOSCOPY;  Surgeon: Hanley Ben, MD;  Location: [facility];  Service: Urology;  Laterality: N/A;  no seeds noted in bladder  . KNEE ARTHROSCOPY  1972   left  . RADIOACTIVE SEED IMPLANT  09/03/2011   Procedure: RADIOACTIVE SEED IMPLANT;  Surgeon: Hanley Ben, MD;  Location: [place];  Service: Urology;  Laterality: N/A;  24 seeds implanted    FAMILY HISTORY:  Family History  Problem Relation Age of Onset  . Diabetes Mother   .  Liver disease Mother   . Hypertension Other   . Diabetes Brother   . Colon cancer Neg Hx   . Cancer Neg Hx   . Breast cancer Neg Hx   . Prostate cancer Neg Hx   . Pancreatic cancer Neg Hx     SOCIAL HISTORY:  Social History   Socioeconomic History  . Marital status: Married    Spouse name: Not on file  . Number of children: Not on file  . Years of education: Not on file  . Highest education level: Not on file  Occupational History   . Occupation: semi retired     Comment: [employer] at [employer]  . Smoking status: Never Smoker  . Smokeless tobacco: Never Used  Substance and Sexual Activity  . Alcohol use: Yes    Comment: occasionally  . Drug use: No  . Sexual activity: Yes  Other Topics Concern  . Not on file  Social History Narrative   Married, 2 children 3 foster children   Social Determinants of Health   Financial Resource Strain:   . Difficulty of Paying Living Expenses: Not on file  Food Insecurity:   . Worried About Charity fundraiser in the Last Year: Not on file  . Ran Out of Food in the Last Year: Not on file  Transportation Needs:   . Lack of Transportation (Medical): Not on file  . Lack of Transportation (Non-Medical): Not on file  Physical Activity:   . Days of Exercise per Week: Not on file  . Minutes of Exercise per Session: Not on file  Stress:   . Feeling of Stress : Not on file  Social Connections:   . Frequency of Communication with Friends and Family: Not on file  . Frequency of Social Gatherings with Friends and Family: Not on file  . Attends Religious Services: Not on file  . Active Member of Clubs or Organizations: Not on file  . Attends Archivist Meetings: Not on file  . Marital Status: Not on file  Intimate Partner Violence:   . Fear of Current or Ex-Partner: Not on file  . Emotionally Abused: Not on file  . Physically Abused: Not on file  . Sexually Abused: Not on file    ALLERGIES: Patient has no known allergies.  MEDICATIONS:  Current Outpatient Medications  Medication Sig Dispense Refill  . amLODipine (NORVASC) 10 MG tablet Take 10 mg by mouth daily.    Marland Kitchen atorvastatin (LIPITOR) 20 MG tablet Take 20 mg by mouth daily.     . benazepril (LOTENSIN) 40 MG tablet Take 40 mg by mouth daily.    . canagliflozin (INVOKANA) 300 MG TABS tablet Take 300 mg by mouth daily before breakfast.    . chlorthalidone (HYGROTON) 25 MG tablet Take 25 mg  by mouth daily.    Marland Kitchen loratadine (CLARITIN) 10 MG tablet Take 10 mg by mouth daily.    . meloxicam (MOBIC) 15 MG tablet Take 15 mg by mouth daily.    . metFORMIN (GLUCOPHAGE) 500 MG tablet Take 500 mg by mouth daily with breakfast.     . montelukast (SINGULAIR) 10 MG tablet Take 10 mg by mouth at bedtime.    . tamsulosin (FLOMAX) 0.4 MG CAPS capsule Take 0.4 mg by mouth daily.    Marland Kitchen dextromethorphan-guaiFENesin (MUCINEX DM) 30-600 MG per 12 hr tablet Take 1 tablet by mouth every 12 (twelve) hours. (Patient not taking: Reported on 01/11/2020)    .  fluticasone (FLONASE) 50 MCG/ACT nasal spray Place into both nostrils daily. (Patient not taking: Reported on 01/11/2020)     No current facility-administered medications for this encounter.    REVIEW OF SYSTEMS:  On review of systems, the patient reports that he is doing well overall. He denies any chest pain, shortness of breath, cough, fevers, chills, night sweats, unintended weight changes. He denies any bowel disturbances, and denies abdominal pain, nausea or vomiting. He reports new onset intermittent right-sided abdominal pain, rated 5 out of 10. His IPSS was 7, indicating mild urinary symptoms. His SHIM was 15, indicating he has moderate erectile dysfunction. He endorses taking a medication for his ED. A complete review of systems is obtained and is otherwise negative.    PHYSICAL EXAM:  Wt Readings from Last 3 Encounters:  01/11/20 215 lb 6.4 oz (97.7 kg)  10/16/11 218 lb 11.2 oz (99.2 kg)  08/23/11 214 lb (97.1 kg)   Temp Readings from Last 3 Encounters:  01/11/20 97.9 F (36.6 C)  10/17/19 98.5 F (36.9 C) (Oral)  02/05/17 98.2 F (36.8 C)   BP Readings from Last 3 Encounters:  01/11/20 129/73  10/17/19 (!) 186/97  02/05/17 (!) 152/80   Pulse Readings from Last 3 Encounters:  01/11/20 85  10/17/19 78  02/05/17 77   Pain Assessment Pain Score: 5  Pain Frequency: Intermittent Pain Loc: Abdomen/10  In general, this is a well  appearing African American male in no acute distress. He is alert and oriented x4 and appropriate throughout the examination. HEENT reveals that the patient is normocephalic, atraumatic. EOMs are intact. PERRLA. Skin is intact without any evidence of gross lesions. Cardiopulmonary assessment is negative for acute distress and he exhibits normal effort. The abdomen is soft, non tender, non distended. Lower extremities are negative for pretibial pitting edema, deep calf tenderness, cyanosis or clubbing.   KPS = 90  100 - Normal; no complaints; no evidence of disease. 90   - Able to carry on normal activity; minor signs or symptoms of disease. 80   - Normal activity with effort; some signs or symptoms of disease. 71   - Cares for self; unable to carry on normal activity or to do active work. 60   - Requires occasional assistance, but is able to care for most of his personal needs. 50   - Requires considerable assistance and frequent medical care. 33   - Disabled; requires special care and assistance. 67   - Severely disabled; hospital admission is indicated although death not imminent. 43   - Very sick; hospital admission necessary; active supportive treatment necessary. 10   - Moribund; fatal processes progressing rapidly. 0     - Dead  Karnofsky DA, Abelmann [place], Craver LS and Burchenal [facility] [phone]) The use of the nitrogen mustards in the palliative treatment of carcinoma: with particular reference to bronchogenic carcinoma Cancer 1 [phone]  LABORATORY DATA:  Lab Results  Component Value Date   WBC 6.3 08/28/2011   HGB 14.1 08/28/2011   HCT 43.0 08/28/2011   MCV 79.5 08/28/2011   PLT 179 08/28/2011   Lab Results  Component Value Date   NA 134 (L) 08/28/2011   K 3.7 08/28/2011   CL 100 08/28/2011   CO2 26 08/28/2011   Lab Results  Component Value Date   ALT 27 08/28/2011   AST 24 08/28/2011   ALKPHOS 144 (H) 08/28/2011   BILITOT 0.3 08/28/2011     RADIOGRAPHY: No results found.  IMPRESSION/PLAN: 1. 69 y.o. gentleman with locally recurrent Gleason 3+4 prostate cancer s/p brachytherapy in 08/2011. Today, we talked to the patient and his wife about the findings and workup thus far. We discussed the natural history of prostate cancer and general treatment, highlighting the role of radiotherapy in the management of locally recurrent disease. We reviewed the recent imaging which demonstrates minimal radioactive seed coverage in the area of hypermetabolism on recent PET. We discussed the available radiation techniques, and focused on the details and logistics of delivery. The recommendation is to proceed with a 5 fraction course of stereotactic body radiotherapy (SBRT) focused on the focally hypermetabolic site of recurrence. We reviewed the anticipated acute and late sequelae associated with radiation in this setting. The patient was encouraged to ask questions that were answered to his satisfaction.  At the conclusion of our conversation, the patient is interested in moving forward with the recommended 5 fraction course of SBRT to the site of disease recurrence in the prostate (retreat s/p brachytherapy). He has provided verbal consent to proceed today and will be scheduled for CT SIM/treatment planning in the near future in anticipation of beginning his treatment shortly thereafter.  We will share our discussion with Dr. Claudia Desanctis and move forward with treatment planning accordingly. We enjoyed meeting him and his wife today and look forward to continuing to participate in his care.   Nicholos Johns, PA-C    Tyler Pita, MD  [facility] Direct Dial: [phone]  Fax: [phone] [email or web address]  Skype  LinkedIn   This document serves as a record of services personally performed by Tyler Pita, MD and Freeman Caldron, PA-C. It was created on their behalf by Wilburn Mylar, a trained medical scribe. The creation of this record is based on the  scribe's personal observations and the provider's statements to them. This document has been checked and approved by the attending provider.

## 2020-01-14 DIAGNOSIS — C61 Malignant neoplasm of prostate: Secondary | ICD-10-CM | POA: Insufficient documentation

## 2020-01-27 ENCOUNTER — Ambulatory Visit
Admission: RE | Admit: 2020-01-27 | Discharge: 2020-01-27 | Disposition: A | Discharge status: Home / Self Care | Payer: Medicare Other | Source: Ambulatory Visit | Attending: Radiation Oncology | Admitting: Radiation Oncology

## 2020-01-27 DIAGNOSIS — Z510 Encounter for antineoplastic radiation therapy: Secondary | ICD-10-CM | POA: Insufficient documentation

## 2020-01-27 DIAGNOSIS — C61 Malignant neoplasm of prostate: Secondary | ICD-10-CM | POA: Insufficient documentation

## 2020-01-28 NOTE — Progress Notes (Signed)
°  Radiation Oncology         [phone] ________________________________  Name: Phu Record MRN: [id number]  Date: 01/27/2020  DOB: 07/05/1950  STEREOTACTIC BODY RADIOTHERAPY SIMULATION AND TREATMENT PLANNING NOTE    ICD-10-CM   1. Recurrent prostate adenocarcinoma ([place])  C61     DIAGNOSIS:  69 yo man with an isolated focal recurrence of prostate cancer in the left gland s/p seed implant  NARRATIVE:  The patient was brought to the [place].  Identity was confirmed.  All relevant records and images related to the planned course of therapy were reviewed.  The patient freely provided informed written consent to proceed with treatment after reviewing the details related to the planned course of therapy. The consent form was witnessed and verified by the simulation staff.  Then, the patient was set-up in a stable reproducible  supine position for radiation therapy.  A BodyFix immobilization pillow was fabricated for reproducible positioning.  Surface markings were placed.  The CT images were loaded into the planning software.  The gross target volumes (GTV) and planning target volumes (PTV) were delinieated, and avoidance structures were contoured.  Treatment planning then occurred.  The radiation prescription was entered and confirmed.  A total of two complex treatment devices were fabricated in the form of the BodyFix immobilization pillow and a neck accuform cushion.  I have requested : 3D Simulation  I have requested a DVH of the following structures: targets and all normal structures near the target including rectum and bladder as noted on the radiation plan to maintain doses in adherence with established limits  SPECIAL TREATMENT PROCEDURE:  The planned course of therapy using radiation constitutes a special treatment procedure. Special care is required in the management of this patient for the following reasons. High dose per fraction requiring special monitoring for  increased toxicities of treatment including daily imaging..  The special nature of the planned course of radiotherapy will require increased physician supervision and oversight to ensure patient's safety with optimal treatment outcomes.  PLAN:  The patient will receive 36.25 Gy in 5 fractions to the PET positive nodule in the prostate, while the whole gland receives 20 Gy in 5 fractions.  ________________________________  Sheral Apley Tammi Klippel, M.D.

## 2020-01-31 DIAGNOSIS — Z760 Encounter for issue of repeat prescription: Secondary | ICD-10-CM | POA: Diagnosis not present

## 2020-01-31 DIAGNOSIS — Z1211 Encounter for screening for malignant neoplasm of colon: Secondary | ICD-10-CM | POA: Diagnosis not present

## 2020-01-31 DIAGNOSIS — C61 Malignant neoplasm of prostate: Secondary | ICD-10-CM | POA: Diagnosis not present

## 2020-01-31 DIAGNOSIS — I10 Essential (primary) hypertension: Secondary | ICD-10-CM | POA: Diagnosis not present

## 2020-02-01 DIAGNOSIS — Z510 Encounter for antineoplastic radiation therapy: Secondary | ICD-10-CM | POA: Diagnosis not present

## 2020-02-01 DIAGNOSIS — C61 Malignant neoplasm of prostate: Secondary | ICD-10-CM | POA: Diagnosis not present

## 2020-02-03 ENCOUNTER — Telehealth: Payer: Self-pay | Admitting: Internal Medicine

## 2020-02-03 NOTE — Telephone Encounter (Signed)
Good morning Dr.Gessner!  We received referral from Primary Care Physician. This patient is recurrent prostate cancer undergoing radiation treatment..   Please advise on scheduling colonoscopy.  Thank you, Have a great day!

## 2020-02-04 DIAGNOSIS — C61 Malignant neoplasm of prostate: Secondary | ICD-10-CM | POA: Diagnosis not present

## 2020-02-07 ENCOUNTER — Ambulatory Visit
Admission: RE | Admit: 2020-02-07 | Discharge: 2020-02-07 | Disposition: A | Discharge status: Home / Self Care | Payer: Medicare Other | Source: Ambulatory Visit | Attending: Radiation Oncology | Admitting: Radiation Oncology

## 2020-02-07 ENCOUNTER — Encounter: Payer: Self-pay | Admitting: Internal Medicine

## 2020-02-07 DIAGNOSIS — C61 Malignant neoplasm of prostate: Secondary | ICD-10-CM

## 2020-02-07 DIAGNOSIS — Z510 Encounter for antineoplastic radiation therapy: Secondary | ICD-10-CM | POA: Diagnosis not present

## 2020-02-07 NOTE — Telephone Encounter (Signed)
Schedule direct colonoscopy  Diagnosis - history of adenomatous colon polyps

## 2020-02-08 ENCOUNTER — Ambulatory Visit: Payer: Medicare Other | Admitting: Radiation Oncology

## 2020-02-09 ENCOUNTER — Ambulatory Visit: Payer: Medicare Other | Admitting: Radiation Oncology

## 2020-02-09 ENCOUNTER — Other Ambulatory Visit: Payer: Self-pay

## 2020-02-09 ENCOUNTER — Ambulatory Visit
Admission: RE | Admit: 2020-02-09 | Discharge: 2020-02-09 | Disposition: A | Discharge status: Home / Self Care | Payer: Medicare Other | Source: Ambulatory Visit | Attending: Radiation Oncology | Admitting: Radiation Oncology

## 2020-02-09 DIAGNOSIS — Z510 Encounter for antineoplastic radiation therapy: Secondary | ICD-10-CM | POA: Diagnosis not present

## 2020-02-09 DIAGNOSIS — C61 Malignant neoplasm of prostate: Secondary | ICD-10-CM | POA: Diagnosis not present

## 2020-02-10 ENCOUNTER — Ambulatory Visit: Payer: Medicare Other | Admitting: Radiation Oncology

## 2020-02-11 ENCOUNTER — Ambulatory Visit
Admission: RE | Admit: 2020-02-11 | Discharge: 2020-02-11 | Disposition: A | Discharge status: Home / Self Care | Payer: Medicare Other | Source: Ambulatory Visit | Attending: Radiation Oncology | Admitting: Radiation Oncology

## 2020-02-11 DIAGNOSIS — Z510 Encounter for antineoplastic radiation therapy: Secondary | ICD-10-CM | POA: Diagnosis not present

## 2020-02-11 DIAGNOSIS — C61 Malignant neoplasm of prostate: Secondary | ICD-10-CM | POA: Diagnosis not present

## 2020-02-14 ENCOUNTER — Ambulatory Visit: Payer: Medicare Other | Admitting: Radiation Oncology

## 2020-02-15 ENCOUNTER — Ambulatory Visit
Admission: RE | Admit: 2020-02-15 | Discharge: 2020-02-15 | Disposition: A | Discharge status: Home / Self Care | Payer: Medicare Other | Source: Ambulatory Visit | Attending: Radiation Oncology | Admitting: Radiation Oncology

## 2020-02-15 DIAGNOSIS — Z510 Encounter for antineoplastic radiation therapy: Secondary | ICD-10-CM | POA: Diagnosis not present

## 2020-02-15 DIAGNOSIS — C61 Malignant neoplasm of prostate: Secondary | ICD-10-CM | POA: Diagnosis not present

## 2020-02-16 ENCOUNTER — Ambulatory Visit: Payer: Medicare Other | Admitting: Radiation Oncology

## 2020-02-17 ENCOUNTER — Encounter: Payer: Self-pay | Admitting: Radiation Oncology

## 2020-02-17 ENCOUNTER — Ambulatory Visit
Admission: RE | Admit: 2020-02-17 | Discharge: 2020-02-17 | Disposition: A | Discharge status: Home / Self Care | Payer: Medicare Other | Source: Ambulatory Visit | Attending: Radiation Oncology | Admitting: Radiation Oncology

## 2020-02-17 DIAGNOSIS — Z510 Encounter for antineoplastic radiation therapy: Secondary | ICD-10-CM | POA: Diagnosis not present

## 2020-02-17 DIAGNOSIS — C61 Malignant neoplasm of prostate: Secondary | ICD-10-CM | POA: Diagnosis not present

## 2020-02-24 ENCOUNTER — Telehealth: Payer: Self-pay | Admitting: Radiation Oncology

## 2020-02-24 NOTE — Telephone Encounter (Signed)
Received voicemail message from patient requesting return call. Phoned to inquire. Patient reports two days ago it was difficult for him to void. Patient reports feeling a lot of pressure and having little urine output. Patient explains this improved with increased fluid intake. Patient reports today he has passed a few small blood clots when urinating. Denies any bowel complaints. Patient completed 5 of 5 SBRT treatments to his prostate on 02/17/20.   Per Dr. Kathrynn Running I explained the symptoms he is experiencing are most likely related to the radiation therapy he received and should improve in the next ten day. Encouraged patient to continue drinking plenty of fluids. Per Dr. Kathrynn Running I instructed the patient to increase his flomax from once daily to one tablet in the AM and one tablet at dinner. Explained tylenol may help relieve some of the pressure he feels with urination per Dr. Kathrynn Running. Finally, explained that the clots may cause him to have urinary retention. Stressed if he is unable to void he should present to the emergency room or [facility] to have a catheter placed. Patient verbalized understanding of all reviewed and appreciation for the assistance.

## 2020-03-20 ENCOUNTER — Telehealth: Payer: Self-pay

## 2020-03-20 NOTE — Telephone Encounter (Signed)
Left vm message for patient to call back in regards to telephone follow-up appointment with Freeman Caldron PA on 03/23/20 @ 10:00am. Called to review meaningful use, AUA and prostate questions. TM

## 2020-03-20 NOTE — Progress Notes (Signed)
  Radiation Oncology         [phone] ________________________________  Name: Bobby Boyle MRN: [id number]  Date: 02/17/2020  DOB: 09-11-1950  End of Treatment Note  Diagnosis:    70 y.o. gentleman with locally recurrent Gleason 3+4 prostate cancer s/p brachytherapy in 08/2011.     Indication for treatment:  Salvage, Curative Re-Irradiation       Radiation treatment dates:   12/13-23/21  Site/dose:   Using PSMA-PET fusion, the recurrent focus of cancer in the central prostate base was treated to 36.25 Gy in 5 fractions while the whole prostate received 20 Gy in 5 fractions using simultaneous integrated boost.  Beams/energy: The patient was treated with IMRT using 3 volumetric arc therapy beams delivering 10 MV FFF X-rays to clockwise and counterclockwise circumferential arcs with collimator offsets to avoid dose scalloping.  Image guidance was performed with daily cone beam CT prior to each fraction to align to gold markers in the prostate and assure proper bladder and rectal fill volumes.  Immobilization was achieved with BodyFix custom mold.  Narrative: The patient tolerated radiation treatment relatively well.     Plan: The patient has completed radiation treatment. The patient will return to radiation oncology clinic for routine followup in one month. I advised him to call or return sooner if he has any questions or concerns related to his recovery or treatment. ________________________________  Sheral Apley. Tammi Klippel, M.D.

## 2020-03-22 ENCOUNTER — Encounter: Payer: Self-pay | Admitting: Urology

## 2020-03-22 ENCOUNTER — Other Ambulatory Visit: Payer: Self-pay

## 2020-03-22 NOTE — Progress Notes (Signed)
Patient has telephone visit with Ashlyn Bruning PA. Patient states that he has had a urinary catheter for 3 weeks. Patient states that prior to catheterization he was not having any urinary issues. Patient states that starting January 2nd or 3rd he developed issues with not being able to void. Patient states that he is still currently taking Flomax as directed. Patient states that he has a follow-up appointment this coming Thursday to further assess the need for the catheter.

## 2020-03-23 ENCOUNTER — Ambulatory Visit
Admission: RE | Admit: 2020-03-23 | Discharge: 2020-03-23 | Disposition: A | Discharge status: Home / Self Care | Payer: Medicare Other | Source: Ambulatory Visit | Attending: Urology | Admitting: Urology

## 2020-03-23 ENCOUNTER — Other Ambulatory Visit: Payer: Self-pay

## 2020-03-23 DIAGNOSIS — C61 Malignant neoplasm of prostate: Secondary | ICD-10-CM

## 2020-03-23 NOTE — Progress Notes (Signed)
Radiation Oncology         [phone]) [phone] ________________________________  Name: Bobby Boyle MRN: [id number]  Date: 03/23/2020  DOB: August 16, 1950  Post Treatment Note  CC: Suella Broad, FNP  Robley Fries, MD  Diagnosis:   70 y.o. gentleman with locally recurrent Gleason 3+4 prostate cancer s/p brachytherapy in 08/2011.     Interval Since Last Radiation:  5 weeks  02/07/20 -02/17/20:SBRT//  Using PSMA-PET fusion, the recurrent focus of cancer in the central prostate base was treated to 36.25 Gy in 5 fractions while the whole prostate received 20 Gy in 5 fractions using simultaneous integrated boost.  Narrative:  I spoke with the patient to conduct his/her routine scheduled 1 month follow up visit via telephone to spare the patient unnecessary potential exposure in the healthcare setting during the current COVID-19 pandemic.  The patient was notified in advance and gave permission to proceed with this visit format. He tolerated radiation treatment relatively well with only mild urinary symptoms and modest fatigue. He reported increased urinary frequency, intermittency, mild dysuria at the start of his stream and nocturia 3-4x/night but denied urgency or gross hematuria.                           On review of systems, the patient states that he is doing fairly well in general. He did well throughout the course of treatments with only mild LUTS but unfortunately, approximately 1 week after completing treatment, he developed acute urinary retention requiring placement of indwelling foley catheter in the urology office.  He has since failed 2 voiding trials and had continued with the indwelling Foley catheter until this morning, when it was removed at a scheduled follow-up visit.  He will return to the urology office this afternoon around 3 PM to assess post void residual, in hopes of continuing without the Foley catheter.  He has continued taking Flomax twice daily as prescribed and  denies any recent fevers, chills, night sweats or gross hematuria.  He reports a healthy appetite and is maintaining his weight.  He denies any significant change in his energy level and is overall satisfied with his progress to date.  ALLERGIES:  has No Known Allergies.  Meds: Current Outpatient Medications  Medication Sig Dispense Refill  . amLODipine (NORVASC) 10 MG tablet Take 10 mg by mouth daily.    Marland Kitchen atorvastatin (LIPITOR) 20 MG tablet Take 20 mg by mouth daily.     . benazepril (LOTENSIN) 40 MG tablet Take 40 mg by mouth daily.    . canagliflozin (INVOKANA) 300 MG TABS tablet Take 300 mg by mouth daily before breakfast.    . chlorthalidone (HYGROTON) 25 MG tablet Take 25 mg by mouth daily.    Marland Kitchen dextromethorphan-guaiFENesin (MUCINEX DM) 30-600 MG per 12 hr tablet Take 1 tablet by mouth every 12 (twelve) hours.    . fluticasone (FLONASE) 50 MCG/ACT nasal spray Place into both nostrils daily.    Marland Kitchen loratadine (CLARITIN) 10 MG tablet Take 10 mg by mouth daily.    . meloxicam (MOBIC) 15 MG tablet Take 15 mg by mouth daily.    . metFORMIN (GLUCOPHAGE) 500 MG tablet Take 500 mg by mouth daily with breakfast.     . montelukast (SINGULAIR) 10 MG tablet Take 10 mg by mouth at bedtime.    . tamsulosin (FLOMAX) 0.4 MG CAPS capsule Take 0.4 mg by mouth daily.     No current facility-administered medications for this  encounter.    Physical Findings:  vitals were not taken for this visit.   /Unable to assess due to telephone follow up visit format.  Lab Findings: Lab Results  Component Value Date   WBC 6.3 08/28/2011   HGB 14.1 08/28/2011   HCT 43.0 08/28/2011   MCV 79.5 08/28/2011   PLT 179 08/28/2011     Radiographic Findings: No results found.  Impression/Plan: 1. 70 y.o. gentleman with locally recurrent Gleason 3+4 prostate cancer s/p brachytherapy in 08/2011.    He will continue to follow up with urology for ongoing PSA determinations and has an appointment scheduled for labs  on 04/27/20 and will see Dr. Gloriann Loan the following week. He understands what to expect with regards to PSA monitoring going forward. I will look forward to following his response to treatment via correspondence with urology, and would be happy to continue to participate in his care if clinically indicated. I talked to the patient about what to expect in the future, including his risk for erectile dysfunction and rectal bleeding. I encouraged him to call or return to the office if he has any questions regarding his previous radiation or possible radiation side effects. He was comfortable with this plan and will follow up as needed. 2. Post-treatment urinary retention: being managed by urology. Unfortunately, he has failed 2 previous voiding trials but had the catheter removed in the office this morning and will go back this afternoon around 3:15pm for PVR in hopes that the catheter does not need to be replaced. He has continued taking Flomax BID as prescribed and knows to hydrate well throughout the day today. We will continue to follow expectantly.    Nicholos Johns, PA-C

## 2020-04-11 ENCOUNTER — Encounter: Payer: Medicare Other | Admitting: Internal Medicine

## 2020-05-26 ENCOUNTER — Encounter: Payer: Self-pay | Admitting: Internal Medicine

## 2020-08-09 ENCOUNTER — Other Ambulatory Visit: Payer: Self-pay

## 2020-08-09 ENCOUNTER — Telehealth: Payer: Self-pay

## 2020-08-09 ENCOUNTER — Ambulatory Visit (AMBULATORY_SURGERY_CENTER): Payer: Medicare Other

## 2020-08-09 VITALS — Ht 65.0 in | Wt 214.0 lb

## 2020-08-09 DIAGNOSIS — Z1211 Encounter for screening for malignant neoplasm of colon: Secondary | ICD-10-CM

## 2020-08-09 DIAGNOSIS — Z86010 Personal history of colonic polyps: Secondary | ICD-10-CM

## 2020-08-09 NOTE — Telephone Encounter (Signed)
Dr. Carlean Purl  In doing a previsit pt notes he had a reoccurrence of prostate cancer needing radiation for 2 months.  Last dose pt thinks was in  12/2019.  Is it ok to proceed?  Thank you

## 2020-08-09 NOTE — Progress Notes (Signed)
No allergies to soy or egg Pt is not on blood thinners or diet pills Denies issues with sedation/intubation Denies atrial flutter/fib Denies constipation   Emmi instructions given to pt  Pt is aware of Covid safety and care partner requirements.  Had cancer reoccurrence and had radiation for 2 months.  Last dose was 12/2019.  TE sent to Dr Carlean Purl

## 2020-08-23 ENCOUNTER — Ambulatory Visit (AMBULATORY_SURGERY_CENTER): Payer: Medicare Other | Admitting: Internal Medicine

## 2020-08-23 ENCOUNTER — Other Ambulatory Visit: Payer: Self-pay

## 2020-08-23 ENCOUNTER — Encounter: Payer: Self-pay | Admitting: Internal Medicine

## 2020-08-23 VITALS — BP 145/66 | HR 60 | Temp 98.9°F | Resp 12 | Ht 65.0 in | Wt 214.0 lb

## 2020-08-23 DIAGNOSIS — D123 Benign neoplasm of transverse colon: Secondary | ICD-10-CM

## 2020-08-23 DIAGNOSIS — Z86010 Personal history of colonic polyps: Secondary | ICD-10-CM | POA: Diagnosis not present

## 2020-08-23 DIAGNOSIS — D120 Benign neoplasm of cecum: Secondary | ICD-10-CM

## 2020-08-23 DIAGNOSIS — D122 Benign neoplasm of ascending colon: Secondary | ICD-10-CM

## 2020-08-23 MED ORDER — SODIUM CHLORIDE 0.9 % IV SOLN: 500.0000 mL | Freq: Once | INTRAVENOUS | Status: DC

## 2020-08-23 NOTE — Progress Notes (Signed)
Called to room to assist during endoscopic procedure.  Patient ID and intended procedure confirmed with present staff. Received instructions for my participation in the procedure from the performing physician.  

## 2020-08-23 NOTE — Patient Instructions (Addendum)
I found and removed 4 tiny polyps today.  I will let you know pathology results and when to have another routine colonoscopy by mail and/or My Chart.  I appreciate the opportunity to care for you.  Gatha Mayer, MD, [facility]  Read all of the handouts given to you by your recovery room nurse.   YOU HAD AN ENDOSCOPIC PROCEDURE TODAY AT [facility]:   Refer to the procedure report that was given to you for any specific questions about what was found during the examination.  If the procedure report does not answer your questions, please call your gastroenterologist to clarify.  If you requested that your care partner not be given the details of your procedure findings, then the procedure report has been included in a sealed envelope for you to review at your convenience later.  YOU SHOULD EXPECT: Some feelings of bloating in the abdomen. Passage of more gas than usual.  Walking can help get rid of the air that was put into your GI tract during the procedure and reduce the bloating. If you had a lower endoscopy (such as a colonoscopy or flexible sigmoidoscopy) you may notice spotting of blood in your stool or on the toilet paper. If you underwent a bowel prep for your procedure, you may not have a normal bowel movement for a few days.  Please Note:  You might notice some irritation and congestion in your nose or some drainage.  This is from the oxygen used during your procedure.  There is no need for concern and it should clear up in a day or so.  SYMPTOMS TO REPORT IMMEDIATELY:  Following lower endoscopy (colonoscopy or flexible sigmoidoscopy):  Excessive amounts of blood in the stool  Significant tenderness or worsening of abdominal pains  Swelling of the abdomen that is new, acute  Fever of 100F or higher   For urgent or emergent issues, a gastroenterologist can be reached at any hour by calling [phone]. Do not use MyChart messaging for urgent concerns.    DIET:   We do recommend a small meal at first, but then you may proceed to your regular diet.  Drink plenty of fluids but you should avoid alcoholic beverages for 24 hours.  ACTIVITY:  You should plan to take it easy for the rest of today and you should NOT DRIVE or use heavy machinery until tomorrow (because of the sedation medicines used during the test).    FOLLOW UP: Our staff will call the number listed on your records 48-72 hours following your procedure to check on you and address any questions or concerns that you may have regarding the information given to you following your procedure. If we do not reach you, we will leave a message.  We will attempt to reach you two times.  During this call, we will ask if you have developed any symptoms of COVID 19. If you develop any symptoms (ie: fever, flu-like symptoms, shortness of breath, cough etc.) before then, please call [phone].  If you test positive for Covid 19 in the 2 weeks post procedure, please call and report this information to [place].    If any biopsies were taken you will be contacted by phone or by letter within the next 1-3 weeks.  Please call us at [phone] if you have not heard about the biopsies in 3 weeks.    SIGNATURES/CONFIDENTIALITY: You and/or your care partner have signed paperwork which will be entered into your electronic medical  record.  These signatures attest to the fact that that the information above on your After Visit Summary has been reviewed and is understood.  Full responsibility of the confidentiality of this discharge information lies with you and/or your care-partner.

## 2020-08-23 NOTE — Progress Notes (Signed)
To PACU, VSS. Report to [email or web address] 

## 2020-08-23 NOTE — Progress Notes (Signed)
Vitals by [place]. Pt's states no medical or surgical changes since previsit or office visit.  

## 2020-08-23 NOTE — Op Note (Signed)
[facility] Patient Name: Bobby Boyle Procedure Date: 08/23/2020 8:18 AM MRN: [id number] Endoscopist: Gatha Mayer , MD Age: 70 Referring MD:  Date of Birth: 01-01-1951 Gender: Male Account #: [id number] Procedure:                Colonoscopy Indications:              Surveillance: Personal history of adenomatous                            polyps on last colonoscopy > 5 years ago Medicines:                Propofol per Anesthesia, Monitored Anesthesia Care Procedure:                Pre-Anesthesia Assessment:                           - Prior to the procedure, a History and Physical                            was performed, and patient medications and                            allergies were reviewed. The patient's tolerance of                            previous anesthesia was also reviewed. The risks                            and benefits of the procedure and the sedation                            options and risks were discussed with the patient.                            All questions were answered, and informed consent                            was obtained. Prior Anticoagulants: The patient has                            taken no previous anticoagulant or antiplatelet                            agents. ASA Grade Assessment: II - A patient with                            mild systemic disease. After reviewing the risks                            and benefits, the patient was deemed in                            satisfactory condition to undergo the procedure.  After obtaining informed consent, the colonoscope                            was passed under direct vision. Throughout the                            procedure, the patient's blood pressure, pulse, and                            oxygen saturations were monitored continuously. The                            Olympus CF-HQ190L (Serial# [id number]) Colonoscope was                             introduced through the anus and advanced to the the                            cecum, identified by appendiceal orifice and                            ileocecal valve. The colonoscopy was performed                            without difficulty. The patient tolerated the                            procedure well. The quality of the bowel                            preparation was good. The ileocecal valve,                            appendiceal orifice, and rectum were photographed.                            The bowel preparation used was Miralax via split                            dose instruction. Scope In: 8:34:28 AM Scope Out: 8:51:49 AM Scope Withdrawal Time: 0 hours 14 minutes 23 seconds  Total Procedure Duration: 0 hours 17 minutes 21 seconds  Findings:                 The perianal and digital rectal examinations were                            normal. Pertinent negatives include normal prostate                            (size, shape, and consistency).                           Four sessile polyps were found in the transverse  colon, ascending colon and cecum. The polyps were                            diminutive in size. These polyps were removed with                            a cold snare. Resection and retrieval were                            complete. Verification of patient identification                            for the specimen was done. Estimated blood loss was                            minimal.                           The exam was otherwise without abnormality on                            direct and retroflexion views. Complications:            No immediate complications. Estimated Blood Loss:     Estimated blood loss was minimal. Impression:               - Four diminutive polyps in the transverse colon,                            in the ascending colon and in the cecum, removed                            with a cold snare. Resected  and retrieved.                           - The examination was otherwise normal on direct                            and retroflexion views.                           - Personal history of colonic polyps. Recommendation:           - Patient has a contact number available for                            emergencies. The signs and symptoms of potential                            delayed complications were discussed with the                            patient. Return to normal activities tomorrow.  Written discharge instructions were provided to the                            patient.                           - Resume previous diet.                           - Continue present medications.                           - Repeat colonoscopy is recommended for                            surveillance. The colonoscopy date will be                            determined after pathology results from today's                            exam become available for review. Gatha Mayer, MD 08/23/2020 8:58:38 AM This report has been signed electronically.

## 2020-08-25 ENCOUNTER — Telehealth: Payer: Self-pay | Admitting: *Deleted

## 2020-08-25 NOTE — Telephone Encounter (Signed)
  Follow up Call-  Call back number 08/23/2020  Post procedure Call Back phone  # [phone]  Permission to leave phone message Yes  Some recent data might be hidden     Patient questions:  Do you have a fever, pain , or abdominal swelling? No. Pain Score  0 *  Have you tolerated food without any problems? Yes.    Have you been able to return to your normal activities? Yes.    Do you have any questions about your discharge instructions: Diet   No. Medications  No. Follow up visit  No.  Do you have questions or concerns about your Care? No.  Actions: * If pain score is 4 or above: No action needed, pain <4.  Have you developed a fever since your procedure? no  2.   Have you had an respiratory symptoms (SOB or cough) since your procedure? no  3.   Have you tested positive for COVID 19 since your procedure no  4.   Have you had any family members/close contacts diagnosed with the COVID 19 since your procedure?  no   If yes to any of these questions please route to Joylene John, RN and Joella Prince, RN

## 2020-08-25 NOTE — Telephone Encounter (Signed)
No answer for post procedure call back. Left VM. 

## 2020-08-30 ENCOUNTER — Encounter: Payer: Self-pay | Admitting: Internal Medicine

## 2021-06-28 IMAGING — CR DG CERVICAL SPINE 2 OR 3 VIEWS
4 series · 4 of 4 positions shown · non-contrast
Comparison: None.

CLINICAL DATA: 69-year-old male with neck pain and bilateral arm
weakness. No known injury.

EXAM:
CERVICAL SPINE - 2-3 VIEW

[w c-spine lat]
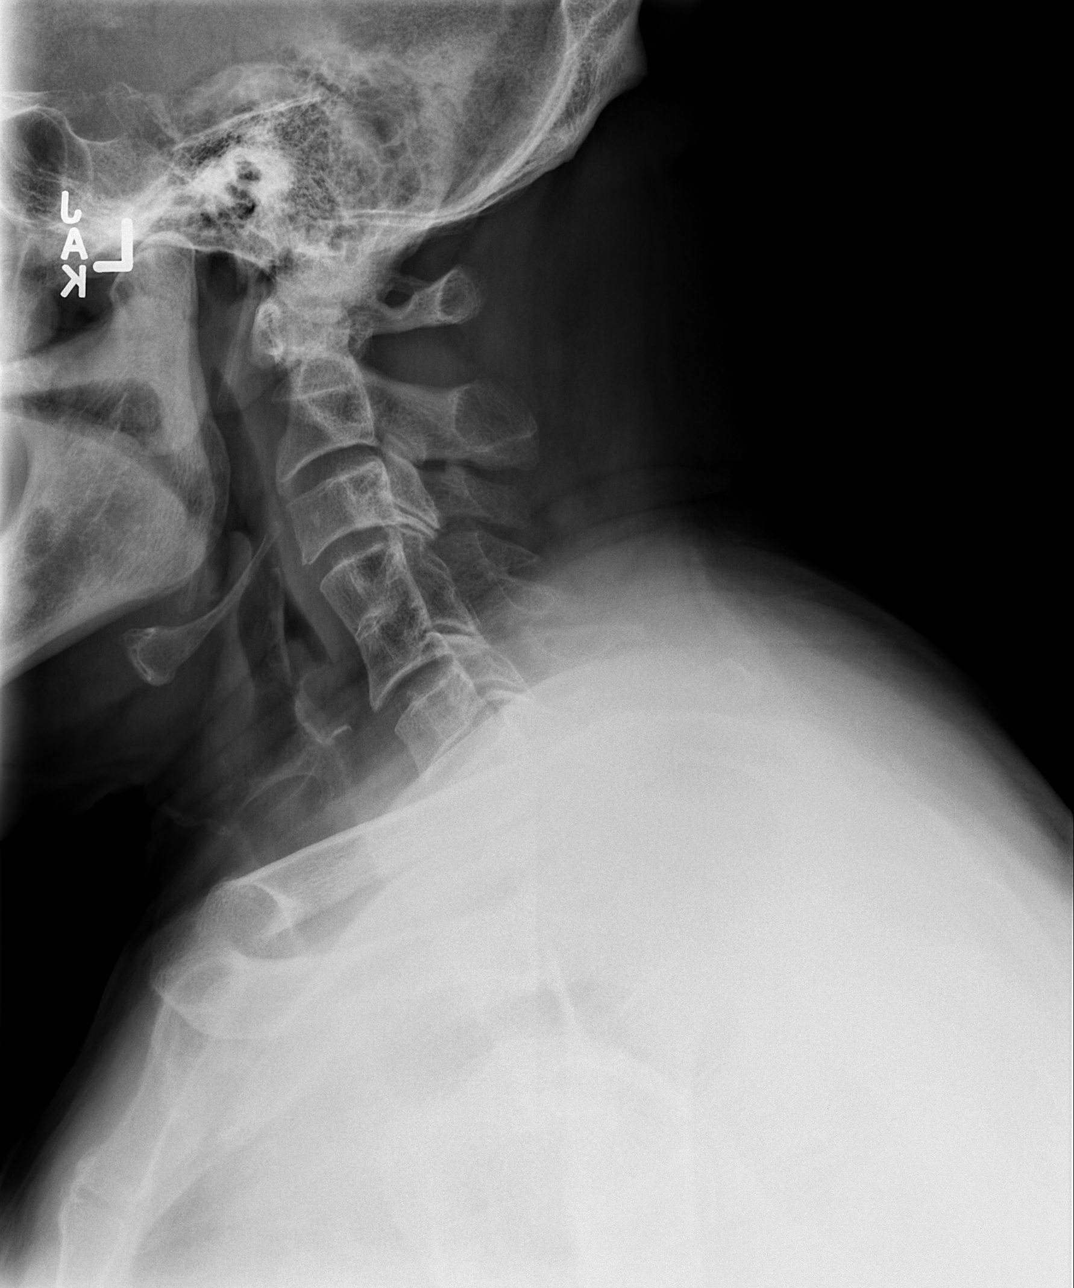

[w c-spine a.p. *]
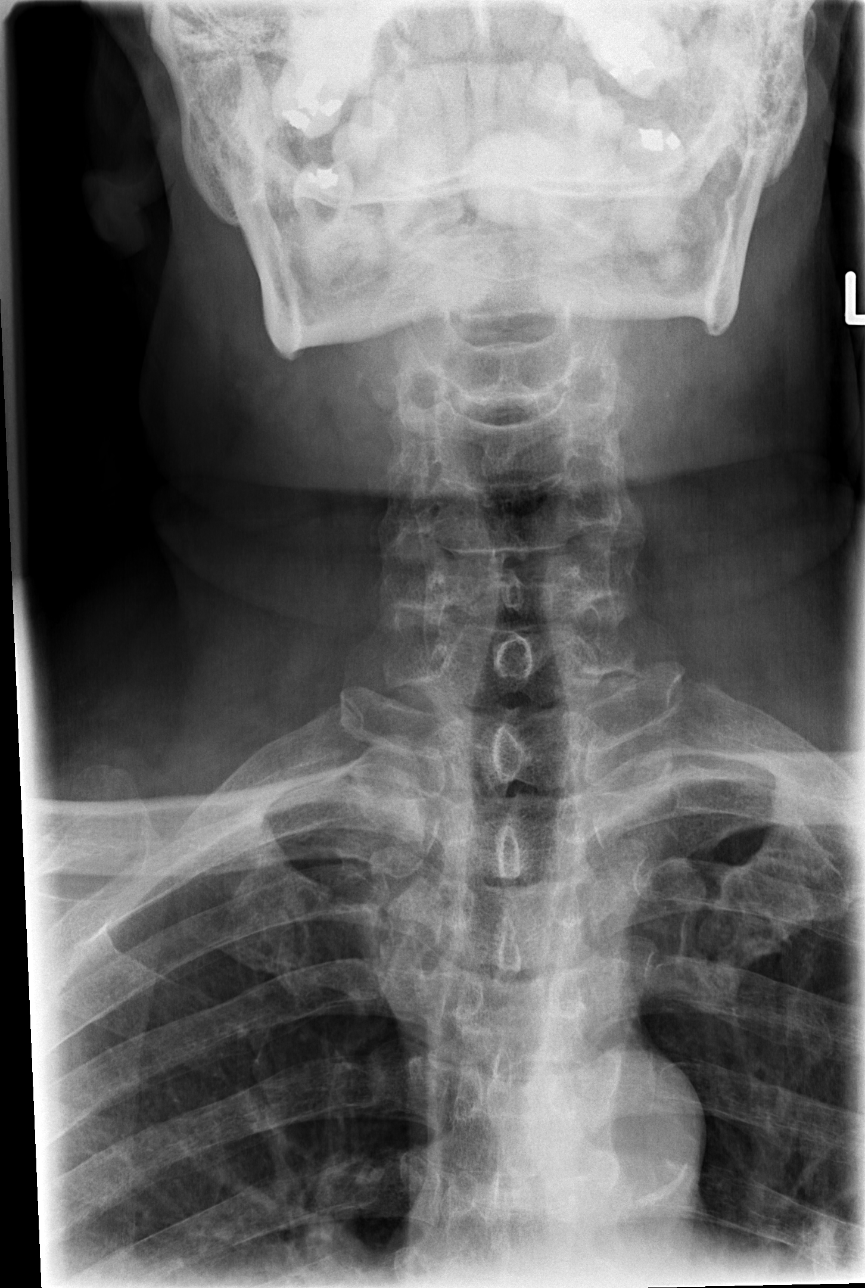

[w c-spine odontoid *]
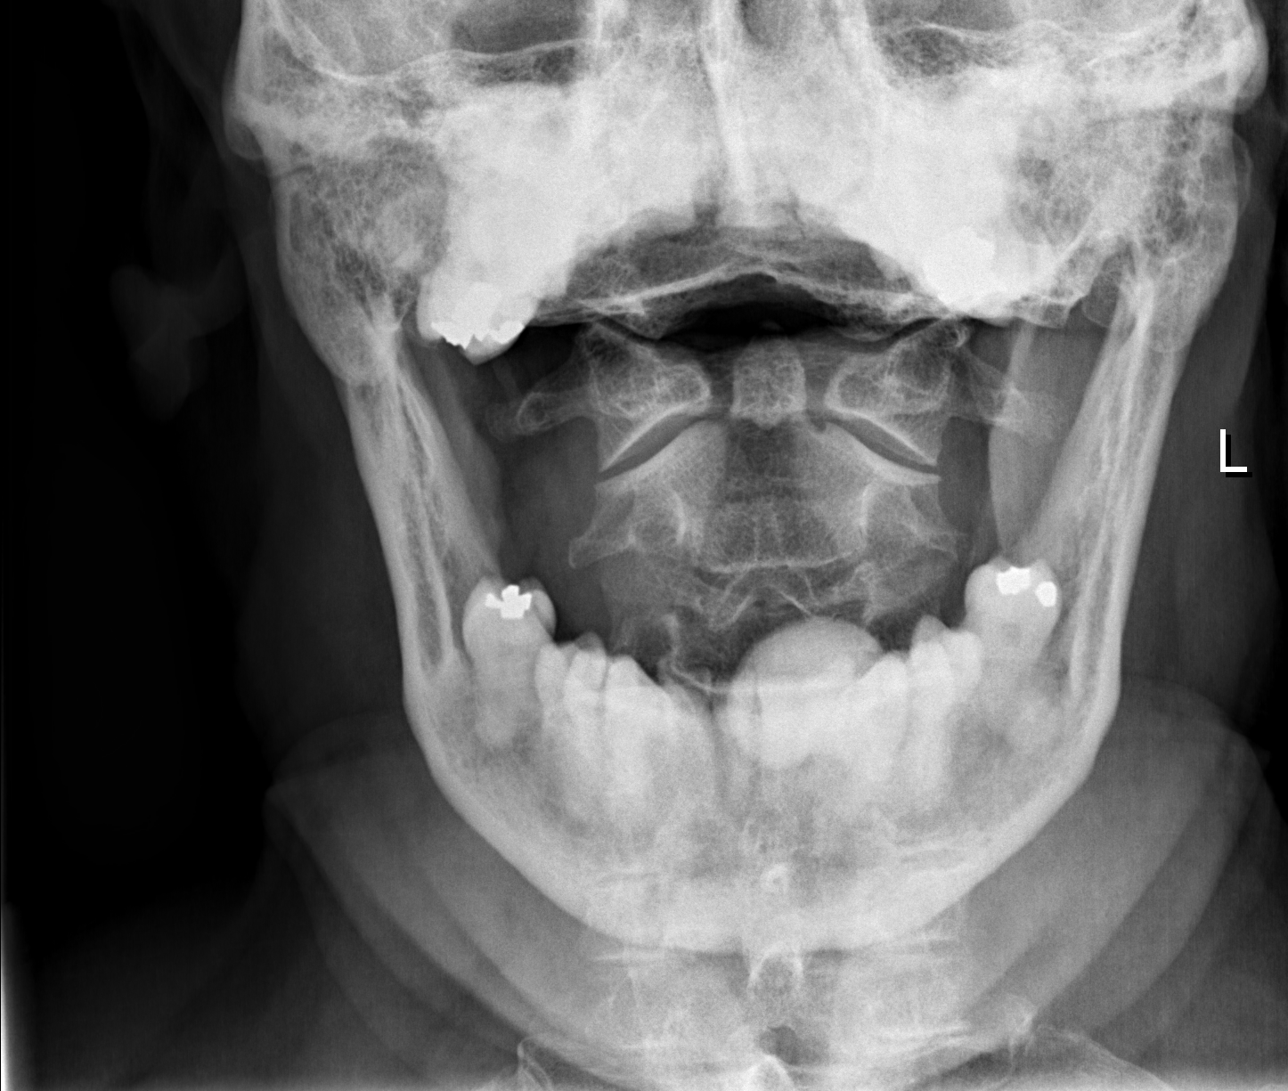

[w swimmers view *]
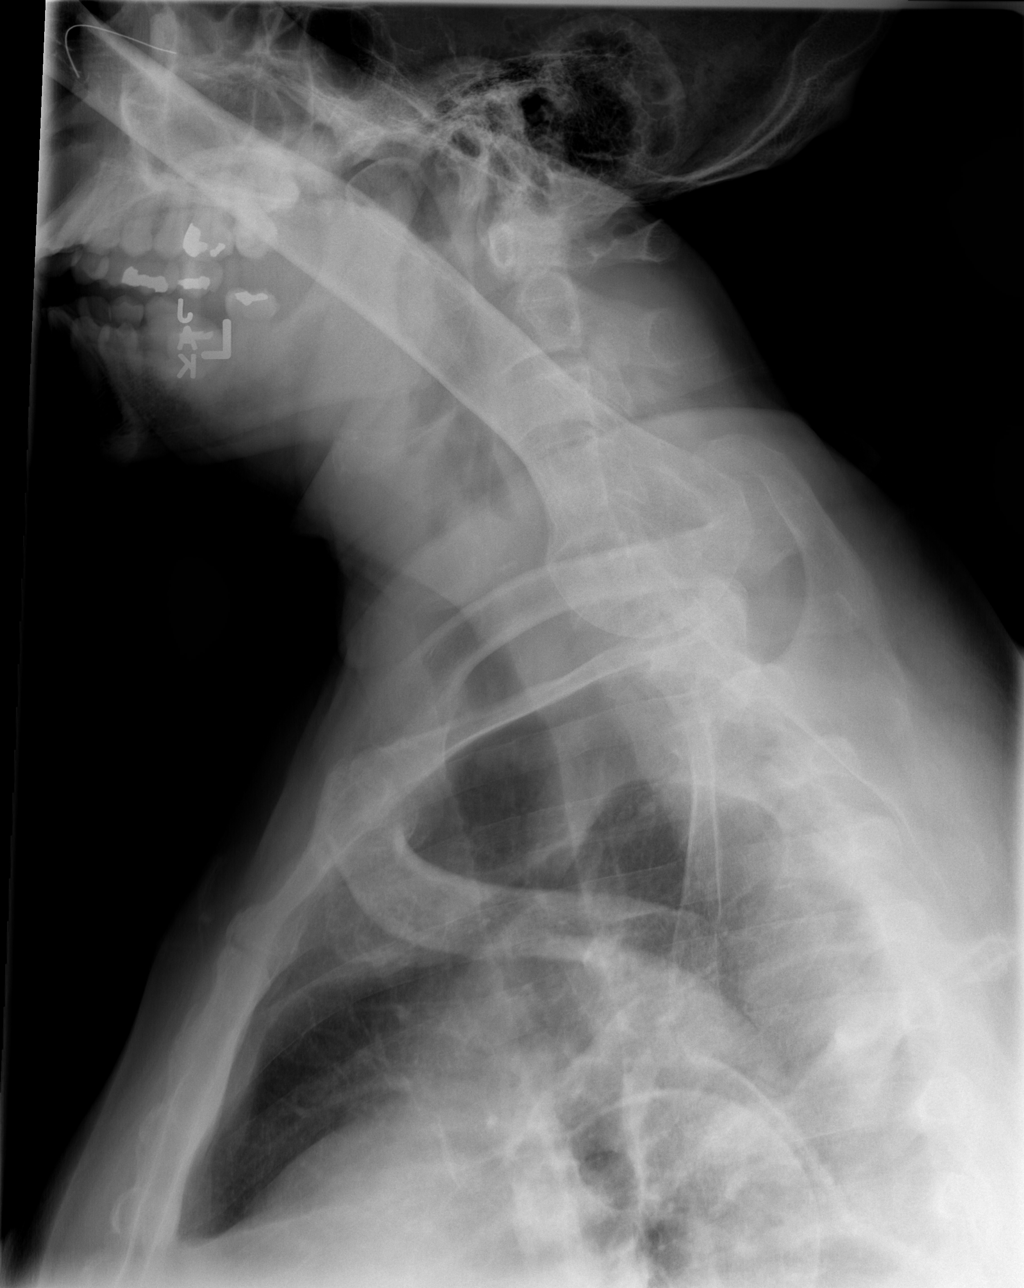

[4 of 4 positions shown; findings below may reference images not displayed]

FINDINGS: There is no acute fracture or subluxation of the cervical spine.
There is bony ankylosis of C4-C5, possibly congenital. The
visualized posterior elements and odontoid appear intact. There is
anatomic alignment of the lateral masses of C1 and C2. The soft
tissues are unremarkable.
IMPRESSION: 1. No acute cervical spine pathology.
2. Bony ankylosis of C4-C5, likely congenital.
